# Patient Record
Sex: Female | Born: 1945
Health system: Southern US, Community
[De-identification: ages and names within clinical notes are randomized; demographics above are authoritative.]

## PROBLEM LIST (undated history)

## (undated) DIAGNOSIS — H3530 Unspecified macular degeneration: Secondary | ICD-10-CM

## (undated) DIAGNOSIS — M109 Gout, unspecified: Secondary | ICD-10-CM

## (undated) DIAGNOSIS — I10 Essential (primary) hypertension: Secondary | ICD-10-CM

## (undated) HISTORY — PX: OTHER SURGICAL HISTORY: SHX169

## (undated) HISTORY — PX: TUBAL LIGATION: SHX77

---

## 2014-06-24 DIAGNOSIS — E876 Hypokalemia: Secondary | ICD-10-CM | POA: Diagnosis not present

## 2014-06-24 DIAGNOSIS — I10 Essential (primary) hypertension: Secondary | ICD-10-CM | POA: Diagnosis not present

## 2014-06-24 DIAGNOSIS — J069 Acute upper respiratory infection, unspecified: Secondary | ICD-10-CM | POA: Diagnosis not present

## 2014-06-24 DIAGNOSIS — R05 Cough: Secondary | ICD-10-CM | POA: Diagnosis not present

## 2014-06-24 DIAGNOSIS — E780 Pure hypercholesterolemia: Secondary | ICD-10-CM | POA: Diagnosis not present

## 2014-06-24 DIAGNOSIS — J209 Acute bronchitis, unspecified: Secondary | ICD-10-CM | POA: Diagnosis not present

## 2014-06-26 ENCOUNTER — Encounter (HOSPITAL_COMMUNITY): Payer: Self-pay | Admitting: *Deleted

## 2014-06-26 ENCOUNTER — Inpatient Hospital Stay (HOSPITAL_COMMUNITY)
Admission: EM | Admit: 2014-06-26 | Discharge: 2014-06-29 | DRG: 195 | Disposition: A | Discharge status: Home / Self Care | Payer: Medicare Other | Attending: Internal Medicine | Admitting: Internal Medicine

## 2014-06-26 ENCOUNTER — Emergency Department (HOSPITAL_COMMUNITY): Payer: Medicare Other

## 2014-06-26 DIAGNOSIS — Z79899 Other long term (current) drug therapy: Secondary | ICD-10-CM | POA: Diagnosis not present

## 2014-06-26 DIAGNOSIS — R42 Dizziness and giddiness: Secondary | ICD-10-CM | POA: Diagnosis not present

## 2014-06-26 DIAGNOSIS — D649 Anemia, unspecified: Secondary | ICD-10-CM | POA: Diagnosis present

## 2014-06-26 DIAGNOSIS — R918 Other nonspecific abnormal finding of lung field: Secondary | ICD-10-CM | POA: Diagnosis not present

## 2014-06-26 DIAGNOSIS — I10 Essential (primary) hypertension: Secondary | ICD-10-CM | POA: Diagnosis not present

## 2014-06-26 DIAGNOSIS — J40 Bronchitis, not specified as acute or chronic: Secondary | ICD-10-CM | POA: Diagnosis not present

## 2014-06-26 DIAGNOSIS — R9431 Abnormal electrocardiogram [ECG] [EKG]: Secondary | ICD-10-CM | POA: Diagnosis not present

## 2014-06-26 DIAGNOSIS — J189 Pneumonia, unspecified organism: Principal | ICD-10-CM | POA: Diagnosis present

## 2014-06-26 DIAGNOSIS — R0602 Shortness of breath: Secondary | ICD-10-CM | POA: Diagnosis not present

## 2014-06-26 DIAGNOSIS — E876 Hypokalemia: Secondary | ICD-10-CM | POA: Diagnosis not present

## 2014-06-26 DIAGNOSIS — I4891 Unspecified atrial fibrillation: Secondary | ICD-10-CM | POA: Diagnosis present

## 2014-06-26 HISTORY — DX: Essential (primary) hypertension: I10

## 2014-06-26 LAB — CBC WITH DIFFERENTIAL/PLATELET
BASOS PCT: 0 % (ref 0–1)
Basophils Absolute: 0 10*3/uL (ref 0.0–0.1)
EOS PCT: 0 % (ref 0–5)
Eosinophils Absolute: 0 10*3/uL (ref 0.0–0.7)
HCT: 38.4 % (ref 36.0–46.0)
Hemoglobin: 13.2 g/dL (ref 12.0–15.0)
LYMPHS PCT: 21 % (ref 12–46)
Lymphs Abs: 1.5 10*3/uL (ref 0.7–4.0)
MCH: 30.7 pg (ref 26.0–34.0)
MCHC: 34.4 g/dL (ref 30.0–36.0)
MCV: 89.3 fL (ref 78.0–100.0)
Monocytes Absolute: 0.6 10*3/uL (ref 0.1–1.0)
Monocytes Relative: 9 % (ref 3–12)
Neutro Abs: 4.8 10*3/uL (ref 1.7–7.7)
Neutrophils Relative %: 70 % (ref 43–77)
Platelets: 266 10*3/uL (ref 150–400)
RBC: 4.3 MIL/uL (ref 3.87–5.11)
RDW: 13.5 % (ref 11.5–15.5)
WBC: 6.9 10*3/uL (ref 4.0–10.5)

## 2014-06-26 LAB — BASIC METABOLIC PANEL
ANION GAP: 13 (ref 5–15)
BUN: 20 mg/dL (ref 6–23)
CALCIUM: 9.5 mg/dL (ref 8.4–10.5)
CO2: 25 mmol/L (ref 19–32)
CREATININE: 1.01 mg/dL (ref 0.50–1.10)
Chloride: 98 mmol/L (ref 96–112)
GFR calc Af Amer: 64 mL/min — ABNORMAL LOW (ref >90)
GFR, EST NON AFRICAN AMERICAN: 55 mL/min — AB (ref >90)
GLUCOSE: 132 mg/dL — AB (ref 70–99)
Potassium: 2.7 mmol/L — CL (ref 3.5–5.1)
Sodium: 136 mmol/L (ref 135–145)

## 2014-06-26 LAB — TROPONIN I: Troponin I: <0.03 ng/mL (ref <0.031)

## 2014-06-26 LAB — MAGNESIUM: MAGNESIUM: 1.9 mg/dL (ref 1.5–2.5)

## 2014-06-26 IMAGING — DX DG CHEST 2V
2 series · 2 of 2 positions shown · non-contrast
Comparison: None.

CLINICAL DATA: 68-year-old female with a 2 week history of cough,
congestion and bronchitis.

EXAM:
CHEST  2 VIEW

[chest pa]
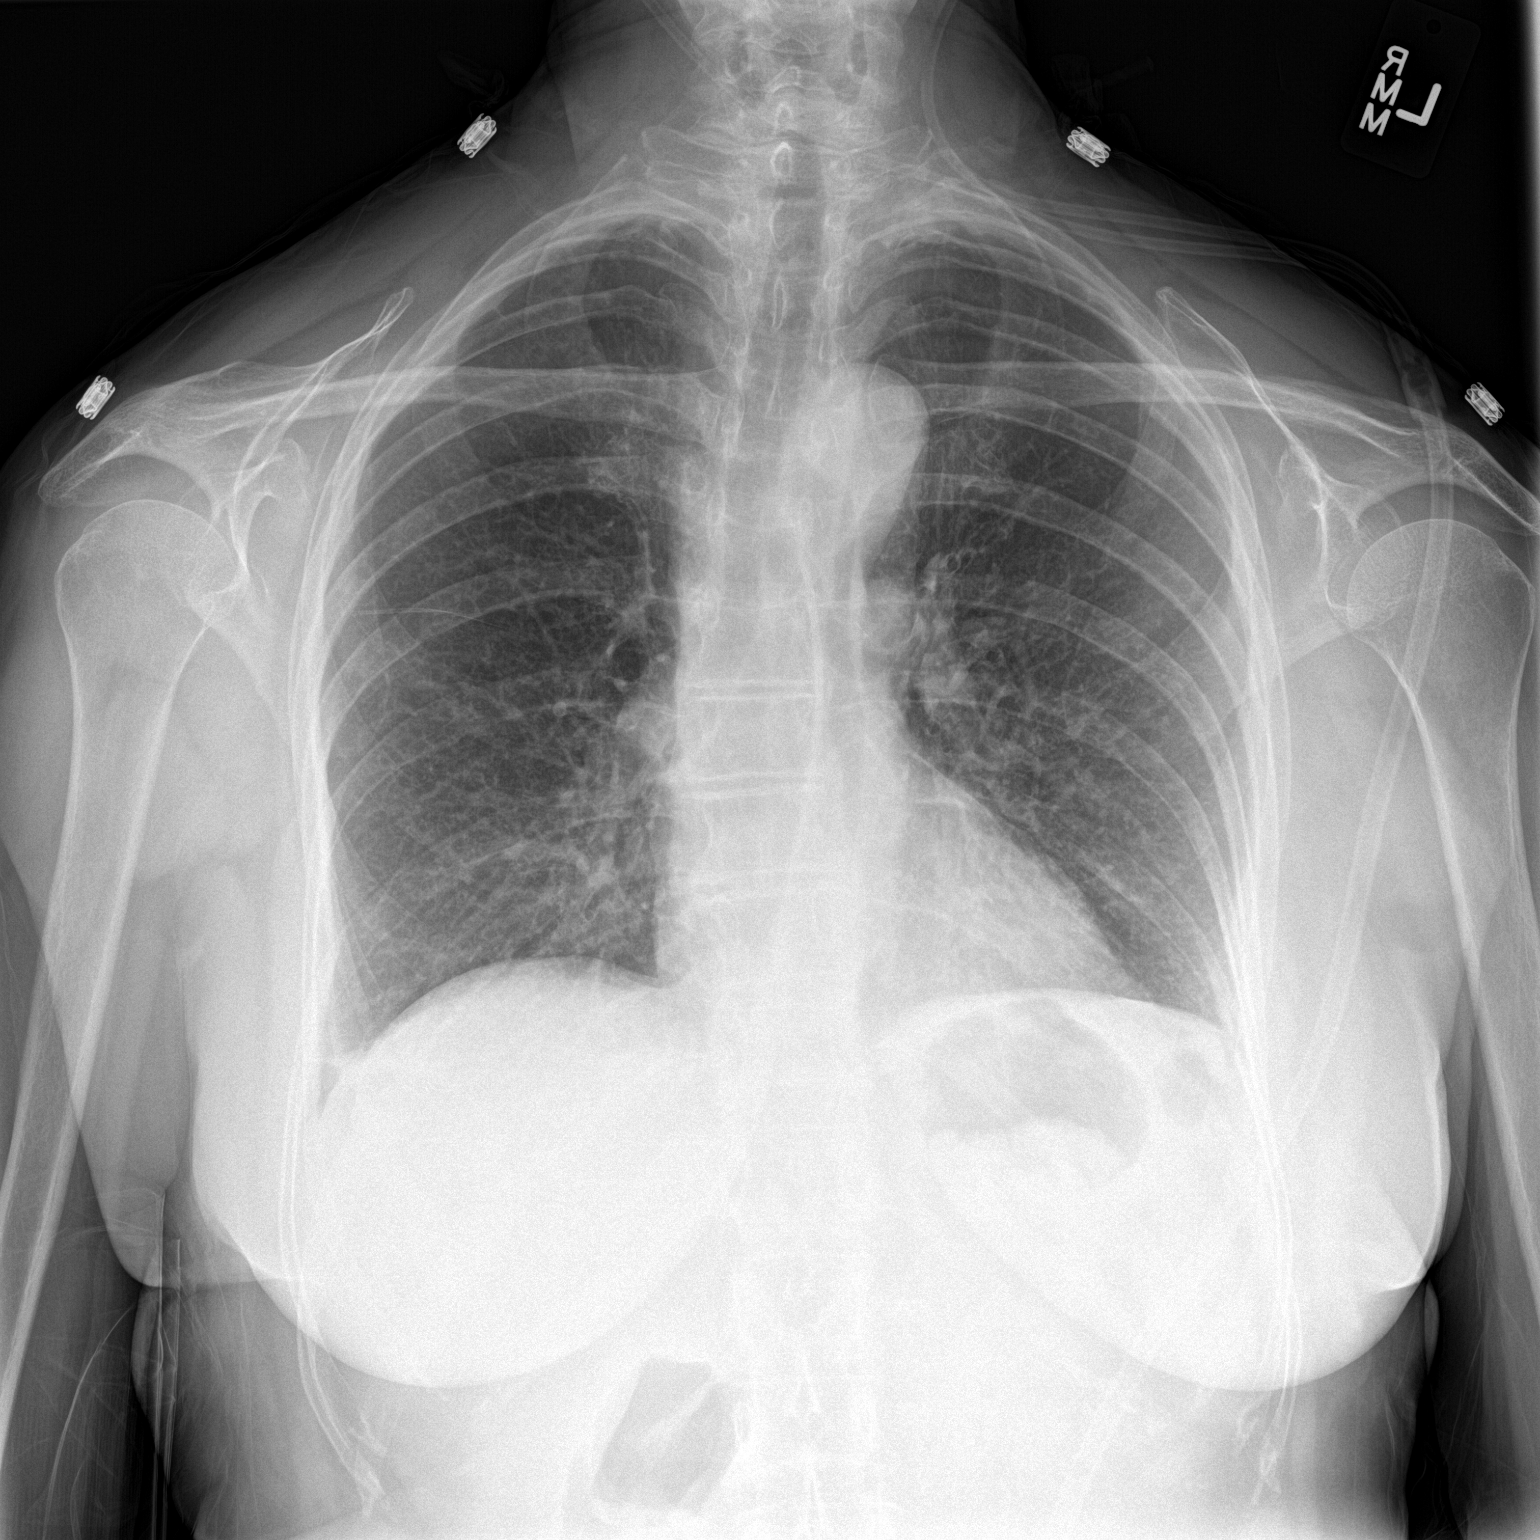

[chest lat]
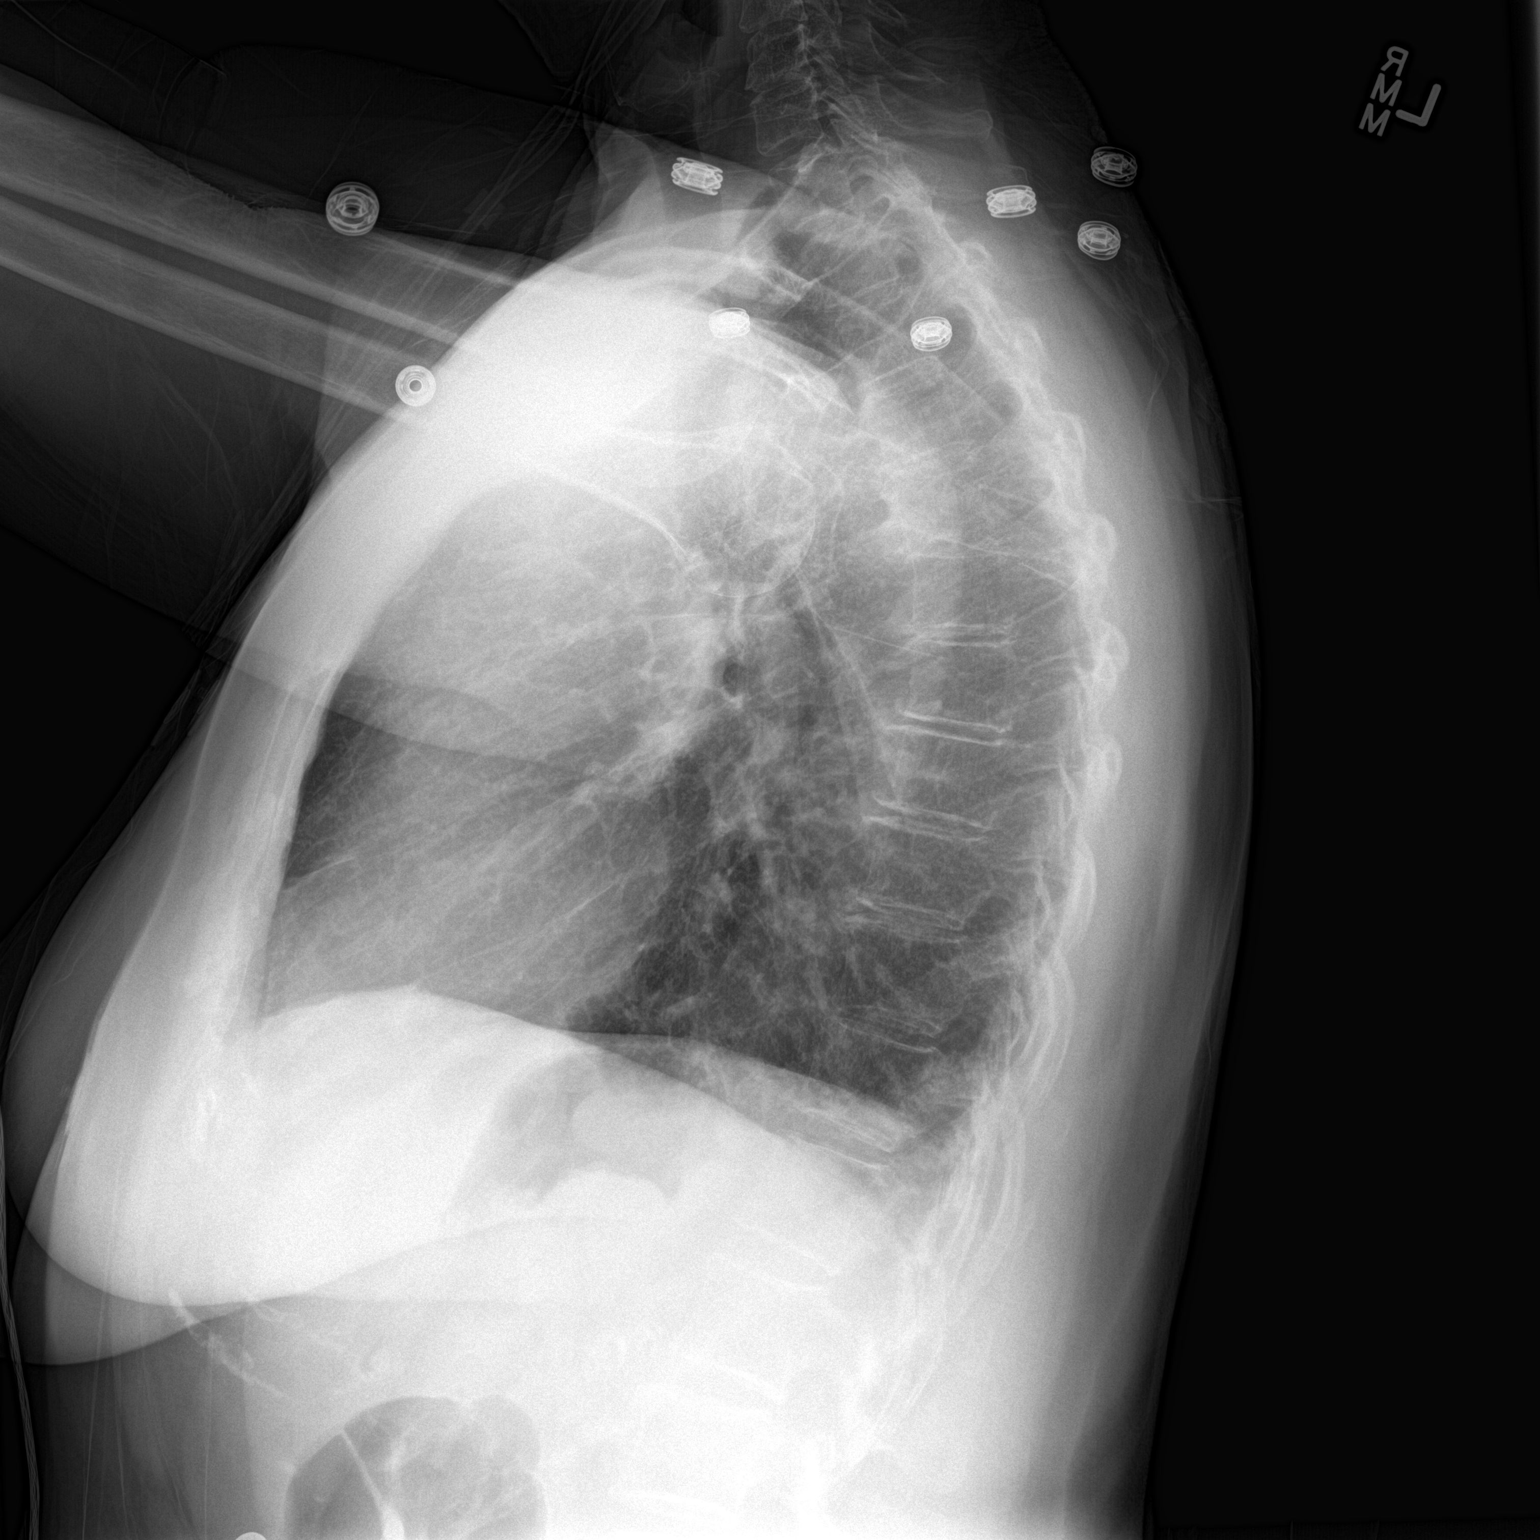

[2 of 2 positions shown; findings below may reference images not displayed]

FINDINGS: Focal patchy airspace opacity in the posterolateral right lower
lobe. Nonspecific central bronchitic change and coarsening of the
interstitial markings may be chronic. Cardiac and mediastinal
contours are within normal limits. No pleural effusion, pulmonary
edema or pneumothorax. No acute osseous abnormality.
IMPRESSION: 1. Patchy airspace opacity in the posterolateral right lower lobe
may concerning for early bronchopneumonia.
2. Central bronchitic change and interstitial coarsening are favored
to reflect chronic parenchymal changes. However, without prior
imaging for comparison an acute atypical infectious process is
difficult to exclude entirely.

## 2014-06-26 MED ORDER — ASPIRIN EC 325 MG PO TBEC
325.0000 mg | DELAYED_RELEASE_TABLET | Freq: Once | ORAL | Status: AC
  Administered 2014-06-26: 325 mg via ORAL
  Filled 2014-06-26: qty 1

## 2014-06-26 MED ORDER — DEXTROSE 5 % IV SOLN
1.0000 g | INTRAVENOUS | Status: DC
  Administered 2014-06-26 – 2014-06-28 (×3): 1 g via INTRAVENOUS
  Filled 2014-06-26 (×4): qty 10

## 2014-06-26 MED ORDER — POTASSIUM CHLORIDE CRYS ER 20 MEQ PO TBCR
40.0000 meq | EXTENDED_RELEASE_TABLET | Freq: Once | ORAL | Status: DC
  Filled 2014-06-26: qty 2

## 2014-06-26 MED ORDER — DEXTROSE 5 % IV SOLN: 500.0000 mg | INTRAVENOUS | Status: DC

## 2014-06-26 MED ORDER — GUAIFENESIN ER 600 MG PO TB12
600.0000 mg | ORAL_TABLET | Freq: Two times a day (BID) | ORAL | Status: DC
  Administered 2014-06-26 – 2014-06-29 (×6): 600 mg via ORAL
  Filled 2014-06-26 (×7): qty 1

## 2014-06-26 MED ORDER — DILTIAZEM LOAD VIA INFUSION
10.0000 mg | Freq: Once | INTRAVENOUS | Status: AC
  Administered 2014-06-26: 10 mg via INTRAVENOUS

## 2014-06-26 MED ORDER — DEXTROSE 5 % IV SOLN
1.0000 g | Freq: Once | INTRAVENOUS | Status: AC
  Administered 2014-06-26: 1 g via INTRAVENOUS
  Filled 2014-06-26: qty 10

## 2014-06-26 MED ORDER — HEPARIN BOLUS VIA INFUSION
4000.0000 [IU] | Freq: Once | INTRAVENOUS | Status: AC
  Administered 2014-06-26: 4000 [IU] via INTRAVENOUS
  Filled 2014-06-26: qty 4000

## 2014-06-26 MED ORDER — HYDROCOD POLST-CHLORPHEN POLST 10-8 MG/5ML PO LQCR
5.0000 mL | Freq: Once | ORAL | Status: AC
Start: 2014-06-26 — End: 2014-06-26
  Administered 2014-06-26: 5 mL via ORAL
  Filled 2014-06-26: qty 5

## 2014-06-26 MED ORDER — DEXTROSE 5 % IV SOLN
500.0000 mg | INTRAVENOUS | Status: DC
  Administered 2014-06-27 – 2014-06-28 (×2): 500 mg via INTRAVENOUS
  Filled 2014-06-26 (×3): qty 500

## 2014-06-26 MED ORDER — SODIUM CHLORIDE 0.9 % IV SOLN
INTRAVENOUS | Status: DC
  Administered 2014-06-26 – 2014-06-27 (×2): via INTRAVENOUS

## 2014-06-26 MED ORDER — SODIUM CHLORIDE 0.9 % IV BOLUS (SEPSIS)
1000.0000 mL | Freq: Once | INTRAVENOUS | Status: AC
  Administered 2014-06-26: 1000 mL via INTRAVENOUS

## 2014-06-26 MED ORDER — DILTIAZEM HCL 100 MG IV SOLR
5.0000 mg/h | INTRAVENOUS | Status: DC
  Administered 2014-06-26: 5 mg/h via INTRAVENOUS

## 2014-06-26 MED ORDER — AZITHROMYCIN 500 MG IV SOLR
500.0000 mg | Freq: Once | INTRAVENOUS | Status: AC
  Administered 2014-06-26: 500 mg via INTRAVENOUS
  Filled 2014-06-26: qty 500

## 2014-06-26 MED ORDER — POTASSIUM CHLORIDE 20 MEQ/15ML (10%) PO SOLN
40.0000 meq | Freq: Once | ORAL | Status: AC
  Administered 2014-06-26: 40 meq via ORAL
  Filled 2014-06-26: qty 30

## 2014-06-26 MED ORDER — POTASSIUM CHLORIDE 10 MEQ/100ML IV SOLN
10.0000 meq | Freq: Once | INTRAVENOUS | Status: AC
  Administered 2014-06-26: 10 meq via INTRAVENOUS
  Filled 2014-06-26: qty 100

## 2014-06-26 MED ORDER — HEPARIN (PORCINE) IN NACL 100-0.45 UNIT/ML-% IJ SOLN
1200.0000 [IU]/h | INTRAMUSCULAR | Status: DC
  Administered 2014-06-26: 1200 [IU]/h via INTRAVENOUS
  Filled 2014-06-26 (×2): qty 250

## 2014-06-26 NOTE — ED Notes (Signed)
[facility]. Pt reports she was dx with bronchitis on Tuesday by PCP after coughing x2 weeks. Pt denies chest pain, n/v, dizziness and lightheadedness.

## 2014-06-26 NOTE — H&P (Signed)
[facility] History and Physical  Patient: Lacey Tran  MRN: [id number]  DOB: 1945-10-18  DOS: the patient was seen and examined on 06/26/2014 PCP: Estanislado Pandy, MD  Chief Complaint: Shortness of breath  HPI: Lacey Tran is a 69 y.o. female with Past medical history of hypertension. The patient presented with complaints of shortness of breath. She mentions that since last 2-3 weeks she has been having productive cough. She was initially treated as bronchitis when she has seen by her PCP. She has received a steroid injection and Tessalon Perles for symptomatic treatment. Despite this her cough has not improved and she progressively has worsening cough. To the extent that she occasionally has some abdominal pain because of the cough. She also started having fever. Over last 2 days that she has episodes of shortness of breath to the extent she would be choking and gasping for air. Due to one of her episode today she was brought here to the ER. Patient denies any complaints of chest pain or chest tightness. She complains of dizziness and lightheadedness with poor oral intake. No prior history of bleeding or surgery or head injury. She has a history of high blood pressure and has been taking hydrochlorothiazide and has been having difficulty maintaining her potassium level.  The patient is coming from home And at her baseline independent for most of her ADL.  Review of Systems: as mentioned in the history of present illness.  A Comprehensive review of the other systems is negative.  Past Medical History  Diagnosis Date  . Hypertension    History reviewed. No pertinent past surgical history. Social History:  reports that she has never smoked. She does not have any smokeless tobacco history on file. She reports that she does not drink alcohol. Her drug history is not on file.  No Known Allergies  History reviewed. No pertinent family history.  Prior to Admission medications    Medication Sig Start Date End Date Taking? Authorizing Provider  benzonatate (TESSALON) 100 MG capsule Take 100 mg by mouth 3 (three) times daily as needed for cough.   Yes Historical Provider, MD  hydrochlorothiazide (HYDRODIURIL) 25 MG tablet Take 25 mg by mouth daily.   Yes Historical Provider, MD  potassium chloride (K-DUR,KLOR-CON) 10 MEQ tablet Take 10 mEq by mouth daily.   Yes Historical Provider, MD    Physical Exam: Filed Vitals:   06/26/14 2000 06/26/14 2015 06/26/14 2045 06/26/14 2100  BP: 142/87 150/78 138/88 128/81  Pulse: 120 127 130 84  Resp: SpO2: 100% 100% 98% 96%    General: Alert, Awake and Oriented to Time, Place and Person. Appear in mild distress Eyes: PERRL ENT: Oral Mucosa clear moist. Neck: mild JVD Cardiovascular: S1 and S2 Present, no Murmur, Peripheral Pulses Present Respiratory: Bilateral Air entry equal and Decreased, bilateral right more than left Crackles, no wheezes Abdomen: Bowel Sound present, Soft and no tender Skin: no Rash Extremities: no Pedal edema, no calf tenderness Neurologic: Grossly no focal neuro deficit.  Labs on Admission:  CBC:  Recent Labs Lab 06/26/14 1802  WBC 6.9  NEUTROABS 4.8  HGB 13.2  HCT 38.4  MCV 89.3  PLT 266    CMP     Component Value Date/Time   NA 136 06/26/2014 1802   K 2.7* 06/26/2014 1802   CL 98 06/26/2014 1802   CO2 25 06/26/2014 1802   GLUCOSE 132* 06/26/2014 1802   BUN 20 06/26/2014 1802   CREATININE  1.01 06/26/2014 1802   CALCIUM 9.5 06/26/2014 1802   GFRNONAA 55* 06/26/2014 1802   GFRAA 64* 06/26/2014 1802    No results for input(s): LIPASE, AMYLASE in the last 168 hours.   Recent Labs Lab 06/26/14 1802  TROPONINI <0.03   BNP (last 3 results) No results for input(s): PROBNP in the last 8760 hours.  Radiological Exams on Admission: Dg Chest 2 View  06/26/2014   CLINICAL DATA:  69 year old female with a 2 week history of cough, congestion and bronchitis.  EXAM:  CHEST  2 VIEW  COMPARISON:  None.  FINDINGS: Focal patchy airspace opacity in the posterolateral right lower lobe. Nonspecific central bronchitic change and coarsening of the interstitial markings may be chronic. Cardiac and mediastinal contours are within normal limits. No pleural effusion, pulmonary edema or pneumothorax. No acute osseous abnormality.  IMPRESSION: 1. Patchy airspace opacity in the posterolateral right lower lobe may concerning for early bronchopneumonia. 2. Central bronchitic change and interstitial coarsening are favored to reflect chronic parenchymal changes. However, without prior imaging for comparison an acute atypical infectious process is difficult to exclude entirely.   Electronically Signed   By: Malachy MoanHeath  McCullough M.D.   On: 06/26/2014 18:51    EKG: Independently reviewed. atrial fibrillation, RVR.  Assessment/Plan Principal Problem:   CAP (community acquired pneumonia) Active Problems:   Atrial fibrillation with RVR, CHA2DS2-VASc Score 3, female, age 15>65, HTN   Abnormal EKG   Hypertension, essential   Hypokalemia   1. CAP (community acquired pneumonia) The patient is presenting with a 2 weeks history of cough with congestion and shortness of breath. Her chest x-ray shows right lower lobe pneumonia. The patient has blood cultures drawn would continue treating her with ceftriaxone and azithromycin. Follow cultures and urine antigens.  2. A. fib with RVR. Abnormal EKG. The patient presented with complaints of cough and shortness of breath. While she was being treated for community-acquired pneumonia she was also found to have A. fib with RVR in the hospital. She had an EKG initially drawn which was showing ST elevation in aVL. This was discussed with cardiology by ER who recommended that the patient does not have any ST elevation and recommended serial troponin. The patient has [place] Vasc score of 3 with her age more than 2665, history of hypertension, and female  sex. [facility]. Patient was started on heparin. Follow serial troponin. Echogram in the morning. Family and patient is undecided about long-term anticoagulation.  3. Hypokalemia. Likely secondary to poor oral intake as well as the hydrochlorothiazide. At present replacing. Check magnesium.  4. Hypertension. Holding hydrochlorthiazide. Blood pressure at present stable. Continue Cardizem.  Advance goals of care discussion: Full code   DVT Prophylaxis therapeutic  anticoagulation Nutrition: Nothing by mouth after midnight  Family Communication: Family  was present at bedside, opportunity was given to ask question and all questions were answered satisfactorily at the time of interview. Disposition: Admitted to inpatient in telemetry unit.  Author: Lynden OxfordPranav Shuntell Foody, MD [facility] Pager: [phone] 06/26/2014, 9:11 PM    If 7PM-7AM, please contact night-coverage [email or web address] Password [email or web address]

## 2014-06-26 NOTE — ED Notes (Signed)
Attempted report to 3 W. 

## 2014-06-26 NOTE — Consult Note (Signed)
ANTICOAGULATION CONSULT NOTE - Initial Consult  Pharmacy Consult for Heparin Indication: atrial fibrillation  No Known Allergies  Patient Measurements: Height: 5\' 6"  (167.6 cm) Weight: 152 lb (68.947 kg) IBW/kg (Calculated) : 59.3 Heparin Dosing Weight: 68kg  Vital Signs: Temp: 98.1 F (36.7 C) (01/28 2123) BP: 114/80 mmHg (01/28 2123) Pulse Rate: 80 (01/28 2123)  Labs:  Recent Labs  06/26/14 1802  HGB 13.2  HCT 38.4  PLT 266  CREATININE 1.01  TROPONINI <0.03    Estimated Creatinine Clearance: 49.2 mL/min (by C-G formula based on Cr of 1.01).   Medical History: Past Medical History  Diagnosis Date  . Hypertension     Medications:  No anticoagulants pta  Assessment: 68yof presents to the ED with SOB - was diagnosed with bronchitis this past Tuesday. While in the ED she developed afib RVR (chadsvasc = 3). She will begin IV heparin. Baseline renal function and CBC wnl.  Goal of Therapy:  Heparin level 0.3-0.7 units/ml Monitor platelets by anticoagulation protocol: Yes   Plan:  1) Heparin bolus 4000 units x 1 2) Heparin drip at 1200 units/hr 3) Check 8 hour heparin level 4) Daily heparin level and CBC  Fredrik RiggerMarkle, Lacey Tran 06/26/2014,9:44 PM

## 2014-06-26 NOTE — ED Provider Notes (Signed)
CSN: [id number]     Arrival date & time 06/26/14  1748 History   First MD Initiated Contact with Patient 06/26/14 1755     Chief Complaint  Patient presents with  . Shortness of Breath     (Consider location/radiation/quality/duration/timing/severity/associated sxs/prior Treatment) Patient is a 69 y.o. female presenting with shortness of breath. The history is provided by the patient. No language interpreter was used.  Shortness of Breath Severity:  Moderate Onset quality:  Gradual Duration:  2 weeks Timing:  Constant Progression:  Waxing and waning Chronicity:  New Relieved by:  Nothing Worsened by:  Nothing tried Ineffective treatments: Steroids. Associated symptoms: cough and sputum production   Associated symptoms: no abdominal pain, no chest pain, no ear pain, no fever, no rash, no vomiting and no wheezing   Cough:    Cough characteristics:  Productive   Sputum characteristics:  White   Severity:  Mild   Onset quality:  Gradual   Duration:  1 week   Timing:  Constant   Progression:  Unchanged   Chronicity:  New   Past Medical History  Diagnosis Date  . Hypertension    History reviewed. No pertinent past surgical history. History reviewed. No pertinent family history. History  Substance Use Topics  . Smoking status: Never Smoker   . Smokeless tobacco: Not on file  . Alcohol Use: No   OB History    No data available     Review of Systems  Constitutional: Negative for fever.  HENT: Negative for ear pain.   Respiratory: Positive for cough, sputum production and shortness of breath. Negative for wheezing.   Cardiovascular: Negative for chest pain.  Gastrointestinal: Negative for nausea, vomiting and abdominal pain.  Genitourinary: Negative for dysuria, urgency and frequency.  Skin: Negative for rash.  All other systems reviewed and are negative.     Allergies  Review of patient's allergies indicates no known allergies.  Home Medications   Prior to  Admission medications   Medication Sig Start Date End Date Taking? Authorizing Provider  benzonatate (TESSALON) 100 MG capsule Take 100 mg by mouth 3 (three) times daily as needed for cough.   Yes Historical Provider, MD  hydrochlorothiazide (HYDRODIURIL) 25 MG tablet Take 25 mg by mouth daily.   Yes Historical Provider, MD  potassium chloride (K-DUR,KLOR-CON) 10 MEQ tablet Take 10 mEq by mouth daily.   Yes Historical Provider, MD   BP 112/66 mmHg  Pulse 56  Temp(Src) 98 F (36.7 C) (Oral)  Resp 16  Ht 5\' 6"  (1.676 m)  Wt 152 lb (68.947 kg)  BMI 24.55 kg/m2  SpO2 100% Physical Exam  Constitutional: She is oriented to person, place, and time. She appears well-developed and well-nourished. No distress.  HENT:  Head: Normocephalic and atraumatic.  Eyes: Pupils are equal, round, and reactive to light.  Neck: Normal range of motion.  Cardiovascular: Normal rate, regular rhythm, normal heart sounds and intact distal pulses.   Pulmonary/Chest: Effort normal. No respiratory distress. She has no wheezes. She has rhonchi in the right lower field. She exhibits no tenderness.  Abdominal: Soft. Bowel sounds are normal. She exhibits no distension. There is no tenderness. There is no rebound and no guarding.  Neurological: She is alert and oriented to person, place, and time. She has normal strength. No cranial nerve deficit or sensory deficit. She exhibits normal muscle tone. Coordination and gait normal.  Skin: Skin is warm and dry.  Nursing note and vitals reviewed.   ED Course  Procedures (including critical care time) Labs Review Labs Reviewed  BASIC METABOLIC PANEL - Abnormal; Notable for the following:    Potassium 2.7 (*)    Glucose, Bld 132 (*)    GFR calc non Af Amer 55 (*)    GFR calc Af Amer 64 (*)    All other components within normal limits  CBC - Abnormal; Notable for the following:    RBC 3.82 (*)    Hemoglobin 11.5 (*)    HCT 34.8 (*)    All other components within normal  limits  HEPARIN LEVEL (UNFRACTIONATED) - Abnormal; Notable for the following:    Heparin Unfractionated 1.20 (*)    All other components within normal limits  COMPREHENSIVE METABOLIC PANEL - Abnormal; Notable for the following:    Albumin 3.1 (*)    GFR calc non Af Amer 83 (*)    All other components within normal limits  CULTURE, EXPECTORATED SPUTUM-ASSESSMENT  CULTURE, BLOOD (ROUTINE X 2)  CULTURE, BLOOD (ROUTINE X 2)  GRAM STAIN  CULTURE, RESPIRATORY (NON-EXPECTORATED)  CBC WITH DIFFERENTIAL/PLATELET  TROPONIN I  MAGNESIUM  TROPONIN I  TROPONIN I  TROPONIN I  INFLUENZA PANEL BY PCR (TYPE A & B, H1N1)  MAGNESIUM  TSH  HIV ANTIBODY (ROUTINE TESTING)  LEGIONELLA ANTIGEN, URINE  STREP PNEUMONIAE URINARY ANTIGEN    Imaging Review Dg Chest 2 View  06/26/2014   CLINICAL DATA:  69 year old female with a 2 week history of cough, congestion and bronchitis.  EXAM: CHEST  2 VIEW  COMPARISON:  None.  FINDINGS: Focal patchy airspace opacity in the posterolateral right lower lobe. Nonspecific central bronchitic change and coarsening of the interstitial markings may be chronic. Cardiac and mediastinal contours are within normal limits. No pleural effusion, pulmonary edema or pneumothorax. No acute osseous abnormality.  IMPRESSION: 1. Patchy airspace opacity in the posterolateral right lower lobe may concerning for early bronchopneumonia. 2. Central bronchitic change and interstitial coarsening are favored to reflect chronic parenchymal changes. However, without prior imaging for comparison an acute atypical infectious process is difficult to exclude entirely.   Electronically Signed   By: Malachy Moan M.D.   On: 06/26/2014 18:51     EKG Interpretation   Date/Time:  Thursday June 26 2014 17:54:30 EST Ventricular Rate:  92 PR Interval:  170 QRS Duration: 91 QT Interval:  365 QTC Calculation: 451 R Axis:   -63 Text Interpretation:  Sinus rhythm Left anterior fascicular block  Abnormal  R-wave progression, late transition Artifact in limb leads ST depressions  anterior leads No old tracing to compare Reconfirmed by GOLDSTON  MD,  SCOTT ([id number]) on 06/26/2014 6:07:09 PM      MDM   Final diagnoses:  CAP (community acquired pneumonia)  Atrial fibrillation with RVR, CHA2DS2-VASc Score 3, female, age >35, HTN  Abnormal EKG  Hypokalemia   Patient is a 69 year old Caucasian female with pertinent past medical history of hypertension who comes to the emergency department today with cough and shortness of breath for the past week. Physical exam as above. En route to the emergency department EMS obtained an EKG which was concerning with an elevation in aVR and diffuse ST depressions. This EKG was discussed with the Cardiology fellow who did not recommend calling this a STEMI and recommended further evaluation as warranted by the patient's condition.  Patient has had no chest pain or exertional shortness of breath as result I doubt an MI. However patient's initial presentation is concerning for pneumonia, ACS, pneumothorax, or bronchitis. Initial workup  included CBC, BMP, troponin, EKG, and a chest x-ray. Chest x-ray demonstrated consolidations in the right lower lobe concerning for pneumonia consistent with the patient's exam.  As a result the patient was treated with azithromycin and Rocephin for community acquired pneumonia. Patient's potassium was low at 2.7 patient's potassium was repleted with 40 mEq of potassium. CBC was unremarkable. Initial troponin was negative.  While in the emergency department patient was treated with aspirin for potential ACS.  While being evaluated she developed atrial fibrillation with RVR with rate of 120s. She was treated with diltiazem and started on a diltiazem drip.  Patient never had atrial fibrillation before. As a result I feel the patient requires admission to the hospital for treatment for her pneumonia and an ACS rule out. The patient was  admitted to the hospitalist service in good condition. Labs and imaging were reviewed by myself and considered in medical decision making. Imaging was interpreted by radiology. Care was discussed with my attending Dr. Criss Alvine.      Bethann Berkshire, MD 06/27/14 1145  Audree Camel, MD 07/05/14 [phone]

## 2014-06-26 NOTE — ED Notes (Signed)
Cardizem increased to 10 mg/hr d/t hr continuing in 120's.

## 2014-06-26 NOTE — ED Notes (Signed)
Dr. Patel at bedside 

## 2014-06-26 NOTE — ED Notes (Addendum)
Called lab RE troponin.  They stated labs are going to be 30 min behind b/c their machine is not working well.  MD notified.

## 2014-06-26 NOTE — ED Notes (Signed)
Attempted to give report x2 

## 2014-06-26 NOTE — ED Notes (Signed)
Dr. Noreene FilbertSchmidt notified about K level.

## 2014-06-27 ENCOUNTER — Encounter (HOSPITAL_COMMUNITY): Payer: Self-pay | Admitting: Cardiology

## 2014-06-27 ENCOUNTER — Other Ambulatory Visit: Payer: Self-pay | Admitting: Cardiology

## 2014-06-27 DIAGNOSIS — R000 Tachycardia, unspecified: Secondary | ICD-10-CM

## 2014-06-27 DIAGNOSIS — R9431 Abnormal electrocardiogram [ECG] [EKG]: Secondary | ICD-10-CM

## 2014-06-27 DIAGNOSIS — I480 Paroxysmal atrial fibrillation: Secondary | ICD-10-CM

## 2014-06-27 DIAGNOSIS — I4891 Unspecified atrial fibrillation: Secondary | ICD-10-CM

## 2014-06-27 DIAGNOSIS — J189 Pneumonia, unspecified organism: Principal | ICD-10-CM

## 2014-06-27 LAB — CBC
HCT: 34.8 % — ABNORMAL LOW (ref 36.0–46.0)
HEMOGLOBIN: 11.5 g/dL — AB (ref 12.0–15.0)
MCH: 30.1 pg (ref 26.0–34.0)
MCHC: 33 g/dL (ref 30.0–36.0)
MCV: 91.1 fL (ref 78.0–100.0)
PLATELETS: 224 10*3/uL (ref 150–400)
RBC: 3.82 MIL/uL — ABNORMAL LOW (ref 3.87–5.11)
RDW: 13.9 % (ref 11.5–15.5)
WBC: 6.2 10*3/uL (ref 4.0–10.5)

## 2014-06-27 LAB — COMPREHENSIVE METABOLIC PANEL
ALBUMIN: 3.1 g/dL — AB (ref 3.5–5.2)
ALT: 17 U/L (ref 0–35)
AST: 27 U/L (ref 0–37)
Alkaline Phosphatase: 55 U/L (ref 39–117)
Anion gap: 6 (ref 5–15)
BUN: 13 mg/dL (ref 6–23)
CHLORIDE: 106 mmol/L (ref 96–112)
CO2: 28 mmol/L (ref 19–32)
Calcium: 8.6 mg/dL (ref 8.4–10.5)
Creatinine, Ser: 0.78 mg/dL (ref 0.50–1.10)
GFR calc Af Amer: >90 mL/min (ref >90)
GFR, EST NON AFRICAN AMERICAN: 83 mL/min — AB (ref >90)
Glucose, Bld: 97 mg/dL (ref 70–99)
POTASSIUM: 3.5 mmol/L (ref 3.5–5.1)
SODIUM: 140 mmol/L (ref 135–145)
Total Bilirubin: 0.7 mg/dL (ref 0.3–1.2)
Total Protein: 7.1 g/dL (ref 6.0–8.3)

## 2014-06-27 LAB — TSH: TSH: 3.867 u[IU]/mL (ref 0.350–4.500)

## 2014-06-27 LAB — EXPECTORATED SPUTUM ASSESSMENT W REFEX TO RESP CULTURE

## 2014-06-27 LAB — INFLUENZA PANEL BY PCR (TYPE A & B)
H1N1FLUPCR: NOT DETECTED
Influenza A By PCR: NEGATIVE
Influenza B By PCR: NEGATIVE

## 2014-06-27 LAB — MAGNESIUM: MAGNESIUM: 1.9 mg/dL (ref 1.5–2.5)

## 2014-06-27 LAB — TROPONIN I: Troponin I: <0.03 ng/mL (ref <0.031)

## 2014-06-27 LAB — EXPECTORATED SPUTUM ASSESSMENT W GRAM STAIN, RFLX TO RESP C

## 2014-06-27 LAB — HEPARIN LEVEL (UNFRACTIONATED): Heparin Unfractionated: 1.2 IU/mL — ABNORMAL HIGH (ref 0.30–0.70)

## 2014-06-27 MED ORDER — DILTIAZEM HCL 30 MG PO TABS
30.0000 mg | ORAL_TABLET | Freq: Four times a day (QID) | ORAL | Status: AC
  Administered 2014-06-27 (×2): 30 mg via ORAL
  Filled 2014-06-27 (×4): qty 1

## 2014-06-27 MED ORDER — RIVAROXABAN 20 MG PO TABS
20.0000 mg | ORAL_TABLET | Freq: Every day | ORAL | Status: DC
  Filled 2014-06-27: qty 1

## 2014-06-27 MED ORDER — DILTIAZEM HCL ER COATED BEADS 120 MG PO CP24
120.0000 mg | ORAL_CAPSULE | Freq: Every day | ORAL | Status: DC
  Administered 2014-06-28 – 2014-06-29 (×2): 120 mg via ORAL
  Filled 2014-06-27 (×2): qty 1

## 2014-06-27 MED ORDER — HEPARIN (PORCINE) IN NACL 100-0.45 UNIT/ML-% IJ SOLN
950.0000 [IU]/h | INTRAMUSCULAR | Status: DC
  Administered 2014-06-27: 950 [IU]/h via INTRAVENOUS
  Filled 2014-06-27: qty 250

## 2014-06-27 MED ORDER — POTASSIUM CHLORIDE CRYS ER 20 MEQ PO TBCR
40.0000 meq | EXTENDED_RELEASE_TABLET | Freq: Once | ORAL | Status: AC
  Administered 2014-06-27: 40 meq via ORAL
  Filled 2014-06-27: qty 2

## 2014-06-27 MED ORDER — RIVAROXABAN (XARELTO) EDUCATION KIT FOR DVT/PE PATIENTS
PACK | Freq: Once | Status: DC
  Filled 2014-06-27: qty 1

## 2014-06-27 NOTE — Progress Notes (Signed)
UR Completed.  [phone]  

## 2014-06-27 NOTE — Progress Notes (Signed)
Discontinued Cardizem IV & started Heparin at 12 ml/hr per order. Patient bradycardic HR in the 50's, patient is asymptomatic, denies chest pain or SOB, VSS. MD notified. Orders given to do STAT EKG, order followed through. RN will continue to monitor.  Toya SmothersLy Jevon Littlepage, RN

## 2014-06-27 NOTE — Care Management Note (Signed)
    Page 1 of 1   06/27/2014     12:12:53 PM CARE MANAGEMENT NOTE 06/27/2014  Patient:  Lacey Tran,Lacey Tran   Account Number:  [id number]  Date Initiated:  06/27/2014  Documentation initiated by:  Donn PieriniWEBSTER,Rumaysa Sabatino  Subjective/Objective Assessment:   Pt admitted with new afib, PNA     Action/Plan:   PTA pt lived at home   Anticipated DC Date:  06/29/2014   Anticipated DC Plan:  HOME/SELF CARE      DC Planning Services  CM consult  Medication Assistance      Choice offered to / List presented to:             Status of service:  In process, will continue to follow Medicare Important Message given?   (If response is "NO", the following Medicare IM given date fields will be blank) Date Medicare IM given:   Medicare IM given by:   Date Additional Medicare IM given:   Additional Medicare IM given by:    Discharge Disposition:    Per UR Regulation:  Reviewed for med. necessity/level of care/duration of stay  If discussed at Long Length of Stay Meetings, dates discussed:    Comments:  06/27/14- 1200- Donn PieriniKristi Nariyah Osias RN, BSN [phone] Referral for Xarelto benefits check- Xarelto is covered- PT COPAY WILL BE $45- PRIOR AUTH IS REQUIRED [phone]- MD notified to call for pre-auth- will give pt 30 day free card.

## 2014-06-27 NOTE — Consult Note (Addendum)
Reason for Consult: atrial fib   Referring Physician: Dr. Ree Kida   PCP:  Manon Hilding, MD  Primary Cardiologist:  Lacey Tran is an 69 y.o. female.    Chief Complaint: SOB   HPI: 69 y.o. female with Past medical history of hypertension, no CAD and no arrhthymias presented to with complaints of shortness of breath. She mentioned that since last 2-3 weeks she has been having productive cough. She was initially treated as bronchitis when she has seen by her PCP. She has received a steroid injection and Tessalon Perles for symptomatic treatment. Despite this her cough has not improved and she progressively has worsening cough. To the extent that she occasionally has some abdominal pain because of the cough.  She also started having fever.  Over last 2 days, prior to admit, she has episodes of shortness of breath to the extent she would be choking and gasping for air.  Dx'd with CAP RLL and Azithromycin/ceftriaxone have been started.    Initially she was found in [place] she did have ST depression but later that evening went into a fib with RVR with ST depression through out. .  By 0700 she had converted back to SR-SB with flipped t wave in III.Marland Kitchen Her CHA2DSS2-VASc is 3- high risk  She has been started on Xarelto- was on IV heparin previously.   Awaiting echo.  Her troponins negative, and her K+ on admit 2.7 ( on HCTZ at home)  Today back to 3.5.  TSH is normal, neg flu screens.    EKG with t wave inversions in lead III.  Previous EKGs in a fib. ST depression in II, V2-6.  She had no chest pain.  No hx stroke, thyroid disease or diabetes.  No hx. CAD.     Past Medical History  Diagnosis Date  . Hypertension     History reviewed. No pertinent past surgical history.  Family History  Problem Relation Age of Onset  . Cancer Mother   . Cancer Father   . Healthy Sister   . Healthy Brother   . Healthy Sister    Social History:  reports that she has never smoked. She has never  used smokeless tobacco. She reports that she does not drink alcohol or use illicit drugs.  Married with 4 children all vaginal delivery  Allergies: No Known Allergies  Medications Prior to Admission  Medication Sig Dispense Refill  . benzonatate (TESSALON) 100 MG capsule Take 100 mg by mouth 3 (three) times daily as needed for cough.    . hydrochlorothiazide (HYDRODIURIL) 25 MG tablet Take 25 mg by mouth daily.    . potassium chloride (K-DUR,KLOR-CON) 10 MEQ tablet Take 10 mEq by mouth daily.      Results for orders placed or performed during the hospital encounter of 06/26/14 (from the past 48 hour(s))  CBC with Differential     Status: None   Collection Time: 06/26/14  6:02 PM  Result Value Ref Range   WBC 6.9 4.0 - 10.5 K/uL   RBC 4.30 3.87 - 5.11 MIL/uL   Hemoglobin 13.2 12.0 - 15.0 g/dL   HCT 38.4 36.0 - 46.0 %   MCV 89.3 78.0 - 100.0 fL   MCH 30.7 26.0 - 34.0 pg   MCHC 34.4 30.0 - 36.0 g/dL   RDW 13.5 11.5 - 15.5 %   Platelets 266 150 - 400 K/uL   Neutrophils Relative % 70 43 - 77 %  Neutro Abs 4.8 1.7 - 7.7 K/uL   Lymphocytes Relative 21 12 - 46 %   Lymphs Abs 1.5 0.7 - 4.0 K/uL   Monocytes Relative 9 3 - 12 %   Monocytes Absolute 0.6 0.1 - 1.0 K/uL   Eosinophils Relative 0 0 - 5 %   Eosinophils Absolute 0.0 0.0 - 0.7 K/uL   Basophils Relative 0 0 - 1 %   Basophils Absolute 0.0 0.0 - 0.1 K/uL  Basic metabolic panel     Status: Abnormal   Collection Time: 06/26/14  6:02 PM  Result Value Ref Range   Sodium 136 135 - 145 mmol/L   Potassium 2.7 (LL) 3.5 - 5.1 mmol/L    Comment: REPEATED TO VERIFY CRITICAL RESULT CALLED TO, READ BACK BY AND VERIFIED WITH: N STEVENS,RN 1912 06/26/14 WBOND    Chloride 98 96 - 112 mmol/L   CO2 25 19 - 32 mmol/L   Glucose, Bld 132 (H) 70 - 99 mg/dL   BUN 20 6 - 23 mg/dL   Creatinine, Ser 1.01 0.50 - 1.10 mg/dL   Calcium 9.5 8.4 - 10.5 mg/dL   GFR calc non Af Amer 55 (L) >90 mL/min   GFR calc Af Amer 64 (L) >90 mL/min    Comment:  (NOTE) The eGFR has been calculated using the CKD EPI equation. This calculation has not been validated in all clinical situations. eGFR's persistently <90 mL/min signify possible Chronic Kidney Disease.    Anion gap 13 5 - 15  Troponin I     Status: None   Collection Time: 06/26/14  6:02 PM  Result Value Ref Range   Troponin I <0.03 <0.031 ng/mL    Comment:        NO INDICATION OF MYOCARDIAL INJURY.   Magnesium     Status: None   Collection Time: 06/26/14  9:07 PM  Result Value Ref Range   Magnesium 1.9 1.5 - 2.5 mg/dL  Troponin I (q 6hr x 3)     Status: None   Collection Time: 06/26/14  9:07 PM  Result Value Ref Range   Troponin I <0.03 <0.031 ng/mL    Comment:        NO INDICATION OF MYOCARDIAL INJURY.   Influenza panel by PCR (type A & B, H1N1)     Status: None   Collection Time: 06/26/14 11:52 PM  Result Value Ref Range   Influenza A By PCR NEGATIVE NEGATIVE   Influenza B By PCR NEGATIVE NEGATIVE   H1N1 flu by pcr NOT DETECTED NOT DETECTED    Comment:        The Xpert Flu assay (FDA approved for nasal aspirates or washes and nasopharyngeal swab specimens), is intended as an aid in the diagnosis of influenza and should not be used as a sole basis for treatment.   Troponin I (q 6hr x 3)     Status: None   Collection Time: 06/27/14  2:54 AM  Result Value Ref Range   Troponin I <0.03 <0.031 ng/mL    Comment:        NO INDICATION OF MYOCARDIAL INJURY.   CBC     Status: Abnormal   Collection Time: 06/27/14  2:54 AM  Result Value Ref Range   WBC 6.2 4.0 - 10.5 K/uL   RBC 3.82 (L) 3.87 - 5.11 MIL/uL   Hemoglobin 11.5 (L) 12.0 - 15.0 g/dL   HCT 34.8 (L) 36.0 - 46.0 %   MCV 91.1 78.0 - 100.0 fL  MCH 30.1 26.0 - 34.0 pg   MCHC 33.0 30.0 - 36.0 g/dL   RDW 13.9 11.5 - 15.5 %   Platelets 224 150 - 400 K/uL  Culture, sputum-assessment     Status: None   Collection Time: 06/27/14  3:54 AM  Result Value Ref Range   Specimen Description SPUTUM    Special Requests  NONE    Sputum evaluation      THIS SPECIMEN IS ACCEPTABLE. RESPIRATORY CULTURE REPORT TO FOLLOW.   Report Status 06/27/2014 FINAL   Culture, respiratory (NON-Expectorated)     Status: None (Preliminary result)   Collection Time: 06/27/14  3:54 AM  Result Value Ref Range   Specimen Description SPUTUM    Special Requests NONE    Gram Stain      MODERATE WBC PRESENT,BOTH PMN AND MONONUCLEAR RARE SQUAMOUS EPITHELIAL CELLS PRESENT FEW GRAM POSITIVE COCCI IN PAIRS IN CLUSTERS RARE GRAM NEGATIVE RODS Performed at [facility]    Culture PENDING    Report Status PENDING   Heparin level (unfractionated)     Status: Abnormal   Collection Time: 06/27/14  7:23 AM  Result Value Ref Range   Heparin Unfractionated 1.20 (H) 0.30 - 0.70 IU/mL    Comment: RESULTS CONFIRMED BY MANUAL DILUTION        IF HEPARIN RESULTS ARE BELOW EXPECTED VALUES, AND PATIENT DOSAGE HAS BEEN CONFIRMED, SUGGEST FOLLOW UP TESTING OF ANTITHROMBIN III LEVELS.   Troponin I (q 6hr x 3)     Status: None   Collection Time: 06/27/14  9:10 AM  Result Value Ref Range   Troponin I <0.03 <0.031 ng/mL    Comment:        NO INDICATION OF MYOCARDIAL INJURY.   Comprehensive metabolic panel     Status: Abnormal   Collection Time: 06/27/14  9:10 AM  Result Value Ref Range   Sodium 140 135 - 145 mmol/L   Potassium 3.5 3.5 - 5.1 mmol/L   Chloride 106 96 - 112 mmol/L   CO2 28 19 - 32 mmol/L   Glucose, Bld 97 70 - 99 mg/dL   BUN 13 6 - 23 mg/dL   Creatinine, Ser 0.78 0.50 - 1.10 mg/dL   Calcium 8.6 8.4 - 10.5 mg/dL   Total Protein 7.1 6.0 - 8.3 g/dL   Albumin 3.1 (L) 3.5 - 5.2 g/dL   AST 27 0 - 37 U/L   ALT 17 0 - 35 U/L   Alkaline Phosphatase 55 39 - 117 U/L   Total Bilirubin 0.7 0.3 - 1.2 mg/dL   GFR calc non Af Amer 83 (L) >90 mL/min   GFR calc Af Amer >90 >90 mL/min    Comment: (NOTE) The eGFR has been calculated using the CKD EPI equation. This calculation has not been validated in all clinical  situations. eGFR's persistently <90 mL/min signify possible Chronic Kidney Disease.    Anion gap 6 5 - 15  Magnesium     Status: None   Collection Time: 06/27/14  9:10 AM  Result Value Ref Range   Magnesium 1.9 1.5 - 2.5 mg/dL  TSH     Status: None   Collection Time: 06/27/14  9:10 AM  Result Value Ref Range   TSH 3.867 0.350 - 4.500 uIU/mL   Dg Chest 2 View  06/26/2014   CLINICAL DATA:  69 year old female with a 2 week history of cough, congestion and bronchitis.  EXAM: CHEST  2 VIEW  COMPARISON:  None.  FINDINGS: Focal patchy airspace  opacity in the posterolateral right lower lobe. Nonspecific central bronchitic change and coarsening of the interstitial markings may be chronic. Cardiac and mediastinal contours are within normal limits. No pleural effusion, pulmonary edema or pneumothorax. No acute osseous abnormality.  IMPRESSION: 1. Patchy airspace opacity in the posterolateral right lower lobe may concerning for early bronchopneumonia. 2. Central bronchitic change and interstitial coarsening are favored to reflect chronic parenchymal changes. However, without prior imaging for comparison an acute atypical infectious process is difficult to exclude entirely.   Electronically Signed   By: Jacqulynn Cadet M.D.   On: 06/26/2014 18:51    ROS: General:+ colds + fevers, no weight changes Skin:no rashes or ulcers HEENT:no blurred vision, no congestion CV:see HPI PUL:see HPI GI:no diarrhea constipation or melena, no indigestion GU:no hematuria, no dysuria MS:no joint pain, no claudication Neuro:no syncope, no lightheadedness Endo:no diabetes, no thyroid disease   Blood pressure 119/64, pulse 66, temperature 98.1 F (36.7 C), temperature source Oral, resp. rate 16, height '5\' 6"'  (1.676 m), weight 152 lb (68.947 kg), SpO2 96 %.  Wt Readings from Last 3 Encounters:  06/27/14 152 lb (68.947 kg)    PE: General:Pleasant affect, NAD, she walks some for exercise and does allof her housework  with vacuuming etc. Skin:Warm and dry, brisk capillary refill HEENT:normocephalic, sclera clear, mucus membranes moist Neck:supple, no JVD, no bruits, no adenopathy  Heart:S1S2 RRR without murmur, gallup, rub or click Lungs:clear to diminishedwithout rales, rhonchi, or wheezes FXT:KWIO, non tender, + BS, do not palpate liver spleen or masses Ext:no lower ext edema, 2+ pedal pulses, 2+ radial pulses Neuro:alert and oriented X 3, MAE, follows commands, + facial symmetry  tele:  SR   Assessment/Plan Principal Problem:   CAP (community acquired pneumonia) on ABX per IM Active Problems:   Atrial fibrillation with RVR, CHA2DS2-VASc Score 3, female, age >74, HTN- with IV heparin xarelto crossover- see MD note --on dilt 30 mg QID.  Could change to po 120 mg CD tomorrow.  Will arrange for outpt event monitor for 30 days.   Abnormal EKG- do lexiscan myoivew before discharge.    Hypertension, essential- controlled.   Hypokalemia- improved was 2.7    [facility]  Nurse Practitioner Certified [place] Pager [phone] or after 5pm or weekends call [phone] 06/27/2014, 2:55 PM  Patient seen and examined with Lacey Kicks, NP-C. We discussed all aspects of the encounter. I agree with the assessment and plan as stated above.   69 y/o woman with limited PMHx (just HTN) admitted with PNA. Today had brief episode of AF that was asx. Resolved spontaneously. Denies any h/o palpitations or other heart problems. ECG during episode was markedly abnormal. CHADSVASC 3 (age,female, HTN).  Given one episode of AF in setting of PN would not commit her to long-term AC at this point as AF (unless LA is very large)  likely precipitated by PNA. Would place event monitor at discharge and if recurrent AF then would anti-coagulate. I discussed this with her and her family at length and offered them possibility of starting [facility] now but they prefer to defer. Given markedly abnormal ECG with AF would  do lexiscan myoview prior to d/c.   Orlanda Frankum,MD 3:26 PM

## 2014-06-27 NOTE — Consult Note (Signed)
ANTICOAGULATION CONSULT NOTE - Follow Up Consult  Pharmacy Consult for Heparin Indication: atrial fibrillation  No Known Allergies  Patient Measurements: Height: 5\' 6"  (167.6 cm) Weight: 152 lb (68.947 kg) IBW/kg (Calculated) : 59.3 Heparin Dosing Weight: 68kg  Vital Signs: Temp: 98 F (36.7 C) (01/29 0300) Temp Source: Oral (01/29 0300) BP: 112/66 mmHg (01/29 0300) Pulse Rate: 56 (01/29 0300)  Labs:  Recent Labs  06/26/14 1802 06/26/14 2107 06/27/14 0254  HGB 13.2  --  11.5*  HCT 38.4  --  34.8*  PLT 266  --  224  CREATININE 1.01  --   --   TROPONINI <0.03 <0.03 <0.03    Estimated Creatinine Clearance: 49.2 mL/min (by C-G formula based on Cr of 1.01).   Medical History: Past Medical History  Diagnosis Date  . Hypertension     Medications:  No anticoagulants pta  Assessment: 68yof presents to the ED with SOB - was diagnosed with bronchitis this past Tuesday. While in the ED she developed afib RVR (chadsvasc = 3). She will begin IV heparin. Baseline renal function and CBC wnl.  Initial heparin level is SUPRAtherapeutic at 1.2 on heparin 1200 units/hr. No bleeding is noted.  Goal of Therapy:  Heparin level 0.3-0.7 units/ml Monitor platelets by anticoagulation protocol: Yes   Plan:   Turn off heparin for 1 hour  Decrease heparin drip to 950 units/hr  Check 8 hour heparin level  Daily heparin level and CBC  Arlean Hoppingorey M. Newman PiesBall, PharmD Clinical Pharmacist Pager [phone] 06/27/2014,8:09 AM

## 2014-06-27 NOTE — Progress Notes (Signed)
[facility]                                                                              Patient Demographics  Lacey Tran, is a 69 y.o. female, DOB - [birth year], ZOX:[id number]  Admit date - 06/26/2014   Admitting Physician Lynden OxfordPranav Patel, MD  Outpatient Primary MD for the patient is Estanislado PandySASSER,PAUL W, MD  LOS - 1   Chief Complaint  Patient presents with  . Shortness of Breath      HPI on 06/27/2014 by Dr. Lynden OxfordPranav Patel Lacey Tran is a 69 y.o. female with Past medical history of hypertension. The patient presented with complaints of shortness of breath. She mentions that since last 2-3 weeks she has been having productive cough. She was initially treated as bronchitis when she has seen by her PCP. She has received a steroid injection and Tessalon Perles for symptomatic treatment. Despite this her cough has not improved and she progressively has worsening cough. To the extent that she occasionally has some abdominal pain because of the cough. She also started having fever. Over last 2 days that she has episodes of shortness of breath to the extent she would be choking and gasping for air. Due to one of her episode today she was brought here to the ER. Patient denies any complaints of chest pain or chest tightness. She complains of dizziness and lightheadedness with poor oral intake. No prior history of bleeding or surgery or head injury. She has a history of high blood pressure and has been taking hydrochlorothiazide and has been having difficulty maintaining her potassium level.  The patient is coming from home And at her baseline independent for most of her ADL.  Assessment & Plan   Community Acquired Pneumonia -CXR: The airspace opacity in the posterior lateral right lower lobe -Continue antibiotics: Azithromycin/ceftriaxone -Blood cultures pending, urine antigens pending  New Atrial fibrillation with RVR -Patient has converted back to sinus rhythm -CHADSVasc 3 -Continue heparin,  will likely transition to xarelto -Pending Echocardiogram -Cardiology consulted and appreciated -troponins cycled and negative -TSH 3.867 -Magnesium 1.9 -Potassium 2.7 upon admission, replaced currently 3.5 (will continue to replace.    Shortness of breath  -Secondary to multifactorial causes: Afib vs CAP -Improving -Treatment and plan as above  Hypokalemia -likely secondary to HCTZ -Will continue to monitor and replace as needed  Hypertension -HCTZ held -BP stable  Code Status: Full  Family Communication: Husband at bedside  Disposition Plan: Admitted  Time Spent in minutes   30 minutes  Procedures  Echocardiogram  Consults   Cardiology  DVT Prophylaxis  Heparin  Lab Results  Component Value Date   PLT 224 06/27/2014    Medications  Scheduled Meds: . azithromycin  500 mg Intravenous Q24H  . cefTRIAXone (ROCEPHIN)  IV  1 g Intravenous Q24H  . diltiazem  30 mg Oral 4 times per day  . guaiFENesin  600 mg Oral BID   Continuous Infusions: . sodium chloride 100 mL/hr at 06/26/14 2316  . heparin     PRN Meds:.  Antibiotics    Anti-infectives    Start     Dose/Rate Route Frequency Ordered Stop   06/27/14 2036  azithromycin (ZITHROMAX) 500 mg in dextrose 5 % 250 mL IVPB     500 mg250 mL/hr over 60 Minutes Intravenous Every 24 hours 06/26/14 2122 07/03/14 2044   06/26/14 2115  cefTRIAXone (ROCEPHIN) 1 g in dextrose 5 % 50 mL IVPB     1 g100 mL/hr over 30 Minutes Intravenous Every 24 hours 06/26/14 2105 07/03/14 2114   06/26/14 2115  azithromycin (ZITHROMAX) 500 mg in dextrose 5 % 250 mL IVPB  Status:  Discontinued     500 mg250 mL/hr over 60 Minutes Intravenous Every 24 hours 06/26/14 2105 06/26/14 2122   06/26/14 1930  cefTRIAXone (ROCEPHIN) 1 g in dextrose 5 % 50 mL IVPB     1 g100 mL/hr over 30 Minutes Intravenous  Once 06/26/14 1925 06/26/14 2030   06/26/14 1930  azithromycin (ZITHROMAX) 500 mg in dextrose 5 % 250 mL IVPB     500 mg250 mL/hr over 60  Minutes Intravenous  Once 06/26/14 1925 06/26/14 2136        Subjective:   Lacey Tran seen and examined today.  Patient feels her breathing has improved.  She states she was told she had bronchitis by her PCP a fews ago.  She denies chest pain or palpitations at this time.  Objective:   Filed Vitals:   06/26/14 2100 06/26/14 2123 06/27/14 0300 06/27/14 0500  BP: 128/81 114/80 112/66   Pulse: 84 80 56   Temp:  98.1 F (36.7 C) 98 F (36.7 C)   TempSrc:  Oral Oral   Resp: Height:   (1.676 m)    Weight:  68.947 kg (152 lb)  68.947 kg (152 lb)  SpO2: 96% 97% 100%     Wt Readings from Last 3 Encounters:  06/27/14 68.947 kg (152 lb)     Intake/Output Summary (Last 24 hours) at 06/27/14 1045 Last data filed at 06/27/14 0700  Gross per 24 hour  Intake      0 ml  Output      0 ml  Net      0 ml    Exam  General: Well developed, well nourished, NAD, appears stated age  HEENT: NCAT,  mucous membranes moist.   Cardiovascular: S1 S2 auscultated, no rubs, murmurs or gallops. Regular rate and rhythm.  Respiratory: Crackles noted in RLL.  No wheezing.  Abdomen: Soft, nontender, nondistended, + bowel sounds  Extremities: warm dry without cyanosis clubbing or edema  Neuro: AAOx3, nonfocal  Psych: Normal affect and demeanor with intact judgement and insight  Data Review   Micro Results Recent Results (from the past 240 hour(s))  Culture, sputum-assessment     Status: None   Collection Time: 06/27/14  3:54 AM  Result Value Ref Range Status   Specimen Description SPUTUM  Final   Special Requests NONE  Final   Sputum evaluation   Final    THIS SPECIMEN IS ACCEPTABLE. RESPIRATORY CULTURE REPORT TO FOLLOW.   Report Status 06/27/2014 FINAL  Final    Radiology Reports Dg Chest 2 View  06/26/2014   CLINICAL DATA:  69 year old female with a 2 week history of cough, congestion and bronchitis.  EXAM: CHEST  2 VIEW  COMPARISON:  None.  FINDINGS: Focal  patchy airspace opacity in the posterolateral right lower lobe. Nonspecific central bronchitic change and coarsening of the interstitial markings may be chronic. Cardiac and mediastinal contours are within normal limits. No pleural effusion, pulmonary edema or pneumothorax. No acute osseous abnormality.  IMPRESSION:  1. Patchy airspace opacity in the posterolateral right lower lobe may concerning for early bronchopneumonia. 2. Central bronchitic change and interstitial coarsening are favored to reflect chronic parenchymal changes. However, without prior imaging for comparison an acute atypical infectious process is difficult to exclude entirely.   Electronically Signed   By: Malachy Moan M.D.   On: 06/26/2014 18:51    CBC  Recent Labs Lab 06/26/14 1802 06/27/14 0254  WBC 6.9 6.2  HGB 13.2 11.5*  HCT 38.4 34.8*  PLT 266 224  MCV 89.3 91.1  MCH 30.7 30.1  MCHC 34.4 33.0  RDW 13.5 13.9  LYMPHSABS 1.5  --   MONOABS 0.6  --   EOSABS 0.0  --   BASOSABS 0.0  --     Chemistries   Recent Labs Lab 06/26/14 1802 06/26/14 2107 06/27/14 0910  NA 136  --  140  K 2.7*  --  3.5  CL 98  --  106  CO2 25  --  28  GLUCOSE 132*  --  97  BUN 20  --  13  CREATININE 1.01  --  0.78  CALCIUM 9.5  --  8.6  MG  --  1.9 1.9  AST  --   --  27  ALT  --   --  17  ALKPHOS  --   --  55  BILITOT  --   --  0.7   ------------------------------------------------------------------------------------------------------------------ estimated creatinine clearance is 62.1 mL/min (by C-G formula based on Cr of 0.78). ------------------------------------------------------------------------------------------------------------------ No results for input(s): HGBA1C in the last 72 hours. ------------------------------------------------------------------------------------------------------------------ No results for input(s): CHOL, HDL, LDLCALC, TRIG, CHOLHDL, LDLDIRECT in the last 72  hours. ------------------------------------------------------------------------------------------------------------------ No results for input(s): TSH, T4TOTAL, T3FREE, THYROIDAB in the last 72 hours.  Invalid input(s): FREET3 ------------------------------------------------------------------------------------------------------------------ No results for input(s): VITAMINB12, FOLATE, FERRITIN, TIBC, IRON, RETICCTPCT in the last 72 hours.  Coagulation profile No results for input(s): INR, PROTIME in the last 168 hours.  No results for input(s): DDIMER in the last 72 hours.  Cardiac Enzymes  Recent Labs Lab 06/26/14 2107 06/27/14 0254 06/27/14 0910  TROPONINI <0.03 <0.03 <0.03   ------------------------------------------------------------------------------------------------------------------ Invalid input(s): POCBNP    Lissete Maestas D.O. on 06/27/2014 at 10:45 AM  Between 7am to 7pm - Pager - [phone]  After 7pm go to [email or web address] - password [email or web address]  And look for the night coverage person covering for me after hours  [facility] Office  [phone]

## 2014-06-27 NOTE — Consult Note (Signed)
ANTICOAGULATION CONSULT NOTE - Follow Up Consult  Pharmacy Consult for Xarelto Indication: atrial fibrillation  No Known Allergies  Patient Measurements: Height: 5\' 6"  (167.6 cm) Weight: 152 lb (68.947 kg) IBW/kg (Calculated) : 59.3 Heparin Dosing Weight: 68kg  Vital Signs: Temp: 98 F (36.7 C) (01/29 0300) Temp Source: Oral (01/29 0300) BP: 112/66 mmHg (01/29 0300) Pulse Rate: 56 (01/29 0300)  Labs:  Recent Labs  06/26/14 1802 06/26/14 2107 06/27/14 0254 06/27/14 0723 06/27/14 0910  HGB 13.2  --  11.5*  --   --   HCT 38.4  --  34.8*  --   --   PLT 266  --  224  --   --   HEPARINUNFRC  --   --   --  1.20*  --   CREATININE 1.01  --   --   --  0.78  TROPONINI <0.03 <0.03 <0.03  --  <0.03    Estimated Creatinine Clearance: 62.1 mL/min (by C-G formula based on Cr of 0.78).   Medical History: Past Medical History  Diagnosis Date  . Hypertension     Medications:  No anticoagulants pta  Assessment: 68yof presents to the ED with SOB - was diagnosed with bronchitis this past Tuesday. While in the ED she developed afib RVR (chadsvasc = 3). She will begin IV heparin. Baseline renal function and CBC wnl.  Initial heparin level is SUPRAtherapeutic at 1.2 on heparin 1200 units/hr. No bleeding is noted.  Pharmacy is consulted to transition heparin to xarelto for atrial fibrillation. Hgb 11.5, Plt 224, sCr 0.78 with CrCl   Goal of Therapy:  Heparin level 0.3-0.7 units/ml Monitor platelets by anticoagulation protocol: Yes   Plan:   Turn off heparin   Start Xarelto 20mg  PO daily  Daily CBC  Monitor s/sx of bleeding  Educate patient on Xarelto  Arlean HoppingCorey M. Newman PiesBall, PharmD Clinical Pharmacist Pager [phone] 06/27/2014,10:54 AM

## 2014-06-28 ENCOUNTER — Inpatient Hospital Stay (HOSPITAL_COMMUNITY): Payer: Medicare Other

## 2014-06-28 DIAGNOSIS — R9431 Abnormal electrocardiogram [ECG] [EKG]: Secondary | ICD-10-CM

## 2014-06-28 LAB — BASIC METABOLIC PANEL
ANION GAP: 2 — AB (ref 5–15)
BUN: 11 mg/dL (ref 6–23)
CO2: 25 mmol/L (ref 19–32)
Calcium: 8.3 mg/dL — ABNORMAL LOW (ref 8.4–10.5)
Chloride: 112 mmol/L (ref 96–112)
Creatinine, Ser: 0.79 mg/dL (ref 0.50–1.10)
GFR calc non Af Amer: 83 mL/min — ABNORMAL LOW (ref >90)
GLUCOSE: 105 mg/dL — AB (ref 70–99)
Potassium: 3.9 mmol/L (ref 3.5–5.1)
Sodium: 139 mmol/L (ref 135–145)

## 2014-06-28 LAB — IRON AND TIBC
Iron: 59 ug/dL (ref 42–145)
Saturation Ratios: 24 % (ref 20–55)
TIBC: 242 ug/dL — ABNORMAL LOW (ref 250–470)
UIBC: 183 ug/dL (ref 125–400)

## 2014-06-28 LAB — RETICULOCYTES
RBC.: 3.77 MIL/uL — AB (ref 3.87–5.11)
RETIC COUNT ABSOLUTE: 56.6 10*3/uL (ref 19.0–186.0)
RETIC CT PCT: 1.5 % (ref 0.4–3.1)

## 2014-06-28 LAB — CBC
HEMATOCRIT: 31.5 % — AB (ref 36.0–46.0)
Hemoglobin: 10.4 g/dL — ABNORMAL LOW (ref 12.0–15.0)
MCH: 30.2 pg (ref 26.0–34.0)
MCHC: 33 g/dL (ref 30.0–36.0)
MCV: 91.6 fL (ref 78.0–100.0)
Platelets: 205 10*3/uL (ref 150–400)
RBC: 3.44 MIL/uL — ABNORMAL LOW (ref 3.87–5.11)
RDW: 14.3 % (ref 11.5–15.5)
WBC: 4.9 10*3/uL (ref 4.0–10.5)

## 2014-06-28 MED ORDER — REGADENOSON 0.4 MG/5ML IV SOLN
0.4000 mg | Freq: Once | INTRAVENOUS | Status: AC
  Administered 2014-06-28: 0.4 mg via INTRAVENOUS
  Filled 2014-06-28: qty 5

## 2014-06-28 MED ORDER — TECHNETIUM TC 99M SESTAMIBI GENERIC - CARDIOLITE
30.0000 | Freq: Once | INTRAVENOUS | Status: AC | PRN
  Administered 2014-06-28: 30 via INTRAVENOUS

## 2014-06-28 MED ORDER — TECHNETIUM TC 99M SESTAMIBI GENERIC - CARDIOLITE
10.0000 | Freq: Once | INTRAVENOUS | Status: AC | PRN
  Administered 2014-06-28: 10 via INTRAVENOUS

## 2014-06-28 MED ORDER — REGADENOSON 0.4 MG/5ML IV SOLN
INTRAVENOUS | Status: AC
  Administered 2014-06-28: 0.4 mg via INTRAVENOUS
  Filled 2014-06-28: qty 5

## 2014-06-28 NOTE — Progress Notes (Signed)
[facility]                                                                              Patient Demographics  Lacey Tran, is a 69 y.o. female, DOB - [birth year], YNW:[id number]  Admit date - 06/26/2014   Admitting Physician Lynden OxfordPranav Patel, MD  Outpatient Primary MD for the patient is Estanislado PandySASSER,PAUL W, MD  LOS - 2   Chief Complaint  Patient presents with  . Shortness of Breath      HPI on 06/27/2014 by Dr. Lynden OxfordPranav Patel Lacey Tran is a 69 y.o. female with Past medical history of hypertension. The patient presented with complaints of shortness of breath. She mentions that since last 2-3 weeks she has been having productive cough. She was initially treated as bronchitis when she has seen by her PCP. She has received a steroid injection and Tessalon Perles for symptomatic treatment. Despite this her cough has not improved and she progressively has worsening cough. To the extent that she occasionally has some abdominal pain because of the cough. She also started having fever. Over last 2 days that she has episodes of shortness of breath to the extent she would be choking and gasping for air. Due to one of her episode today she was brought here to the ER. Patient denies any complaints of chest pain or chest tightness. She complains of dizziness and lightheadedness with poor oral intake. No prior history of bleeding or surgery or head injury. She has a history of high blood pressure and has been taking hydrochlorothiazide and has been having difficulty maintaining her potassium level.  The patient is coming from home And at her baseline independent for most of her ADL.  Assessment & Plan   Community Acquired Pneumonia -CXR: The airspace opacity in the posterior lateral right lower lobe -Continue antibiotics: Azithromycin/ceftriaxone -Blood cultures pending, urine antigens pending -Sputum culture: mod WBC, few GPC, rare GNR -influenza negative  New Atrial fibrillation with RVR -Patient  has converted back to sinus rhythm -CHADSVasc 3 -Initially started on heparin and discontinued -Continue cardizem PO -Cardiology consulted and appreciated and recommended holding anticoagulation for now (patient request) and if Afib occurs again-would anticoagulate -troponins cycled and negative -TSH 3.867 -Magnesium 1.9 -Potassium 2.7 upon admission, replaced currently 3.9  Abnormal EKG -Troponins negative -Patient chest pain free -Lexiscan pending  Shortness of breath  -Secondary to multifactorial causes: Afib vs CAP -Improving -Treatment and plan as above  Hypokalemia -likely secondary to HCTZ -Will continue to monitor and replace as needed  Hypertension -HCTZ held -BP stable  Normocytic Anemia -Drop in Hb likely dilutional -Continue to monitor CBC  Code Status: Full  Family Communication: Husband at bedside  Disposition Plan: Admitted  Time Spent in minutes   30 minutes  Procedures  Echocardiogram Lexiscan  Consults   Cardiology  DVT Prophylaxis  Heparin  Lab Results  Component Value Date   PLT 205 06/28/2014    Medications  Scheduled Meds: . azithromycin  500 mg Intravenous Q24H  . cefTRIAXone (ROCEPHIN)  IV  1 g Intravenous Q24H  . diltiazem  120 mg Oral Daily  . guaiFENesin  600 mg Oral BID   Continuous Infusions: . sodium  chloride 100 mL/hr at 06/27/14 2029   PRN Meds:.  Antibiotics    Anti-infectives    Start     Dose/Rate Route Frequency Ordered Stop   06/27/14 2036  azithromycin (ZITHROMAX) 500 mg in dextrose 5 % 250 mL IVPB     500 mg250 mL/hr over 60 Minutes Intravenous Every 24 hours 06/26/14 2122 07/03/14 2044   06/26/14 2115  cefTRIAXone (ROCEPHIN) 1 g in dextrose 5 % 50 mL IVPB     1 g100 mL/hr over 30 Minutes Intravenous Every 24 hours 06/26/14 2105 07/03/14 2114   06/26/14 2115  azithromycin (ZITHROMAX) 500 mg in dextrose 5 % 250 mL IVPB  Status:  Discontinued     500 mg250 mL/hr over 60 Minutes Intravenous Every 24 hours  06/26/14 2105 06/26/14 2122   06/26/14 1930  cefTRIAXone (ROCEPHIN) 1 g in dextrose 5 % 50 mL IVPB     1 g100 mL/hr over 30 Minutes Intravenous  Once 06/26/14 1925 06/26/14 2030   06/26/14 1930  azithromycin (ZITHROMAX) 500 mg in dextrose 5 % 250 mL IVPB     500 mg250 mL/hr over 60 Minutes Intravenous  Once 06/26/14 1925 06/26/14 2136        Subjective:   Lacey Tran seen and examined today.  Patient feels her breathing has improved, but she continues to have productive cough.  She denies chest pain, abdominal pain, headache, dizziness.  Objective:   Filed Vitals:   06/28/14 0925 06/28/14 1003 06/28/14 1005 06/28/14 1007  BP: 127/81 130/84 143/71 143/71  Pulse: 59     Temp:      TempSrc:      Resp: Height:      Weight:      SpO2:        Wt Readings from Last 3 Encounters:  06/28/14 68.9 kg (151 lb 14.4 oz)     Intake/Output Summary (Last 24 hours) at 06/28/14 1018 Last data filed at 06/28/14 1014  Gross per 24 hour  Intake    120 ml  Output      0 ml  Net    120 ml    Exam  General: Well developed, well nourished, NAD, appears stated age  HEENT: NCAT,  mucous membranes moist.   Cardiovascular: S1 S2 auscultated, no rubs, murmurs or gallops. Regular rate and rhythm.  Respiratory: diminished breath sounds, mild crackles  Abdomen: Soft, nontender, nondistended, + bowel sounds  Extremities: warm dry without cyanosis clubbing or edema  Neuro: AAOx3, nonfocal  Psych: Normal affect and demeanor with intact judgement and insight  Data Review   Micro Results Recent Results (from the past 240 hour(s))  Culture, sputum-assessment     Status: None   Collection Time: 06/27/14  3:54 AM  Result Value Ref Range Status   Specimen Description SPUTUM  Final   Special Requests NONE  Final   Sputum evaluation   Final    THIS SPECIMEN IS ACCEPTABLE. RESPIRATORY CULTURE REPORT TO FOLLOW.   Report Status 06/27/2014 FINAL  Final  Culture, respiratory  (NON-Expectorated)     Status: None (Preliminary result)   Collection Time: 06/27/14  3:54 AM  Result Value Ref Range Status   Specimen Description SPUTUM  Final   Special Requests NONE  Final   Gram Stain   Final    MODERATE WBC PRESENT,BOTH PMN AND MONONUCLEAR RARE SQUAMOUS EPITHELIAL CELLS PRESENT FEW GRAM POSITIVE COCCI IN PAIRS IN CLUSTERS RARE GRAM NEGATIVE RODS Performed at [facility]  Culture PENDING  Incomplete   Report Status PENDING  Incomplete    Radiology Reports Dg Chest 2 View  06/26/2014   CLINICAL DATA:  69 year old female with a 2 week history of cough, congestion and bronchitis.  EXAM: CHEST  2 VIEW  COMPARISON:  None.  FINDINGS: Focal patchy airspace opacity in the posterolateral right lower lobe. Nonspecific central bronchitic change and coarsening of the interstitial markings may be chronic. Cardiac and mediastinal contours are within normal limits. No pleural effusion, pulmonary edema or pneumothorax. No acute osseous abnormality.  IMPRESSION: 1. Patchy airspace opacity in the posterolateral right lower lobe may concerning for early bronchopneumonia. 2. Central bronchitic change and interstitial coarsening are favored to reflect chronic parenchymal changes. However, without prior imaging for comparison an acute atypical infectious process is difficult to exclude entirely.   Electronically Signed   By: Malachy Moan M.D.   On: 06/26/2014 18:51    CBC  Recent Labs Lab 06/26/14 1802 06/27/14 0254 06/28/14 0325  WBC 6.9 6.2 4.9  HGB 13.2 11.5* 10.4*  HCT 38.4 34.8* 31.5*  PLT 266 224 205  MCV 89.3 91.1 91.6  MCH 30.7 30.1 30.2  MCHC 34.4 33.0 33.0  RDW 13.5 13.9 14.3  LYMPHSABS 1.5  --   --   MONOABS 0.6  --   --   EOSABS 0.0  --   --   BASOSABS 0.0  --   --     Chemistries   Recent Labs Lab 06/26/14 1802 06/26/14 2107 06/27/14 0910 06/28/14 0325  NA 136  --  140 139  K 2.7*  --  3.5 3.9  CL 98  --  106 112  CO2 25  --  28 25    GLUCOSE 132*  --  97 105*  BUN 20  --  13 11  CREATININE 1.01  --  0.78 0.79  CALCIUM 9.5  --  8.6 8.3*  MG  --  1.9 1.9  --   AST  --   --  27  --   ALT  --   --  17  --   ALKPHOS  --   --  55  --   BILITOT  --   --  0.7  --    ------------------------------------------------------------------------------------------------------------------ estimated creatinine clearance is 62.1 mL/min (by C-G formula based on Cr of 0.79). ------------------------------------------------------------------------------------------------------------------ No results for input(s): HGBA1C in the last 72 hours. ------------------------------------------------------------------------------------------------------------------ No results for input(s): CHOL, HDL, LDLCALC, TRIG, CHOLHDL, LDLDIRECT in the last 72 hours. ------------------------------------------------------------------------------------------------------------------  Recent Labs  06/27/14 0910  TSH 3.867   ------------------------------------------------------------------------------------------------------------------ No results for input(s): VITAMINB12, FOLATE, FERRITIN, TIBC, IRON, RETICCTPCT in the last 72 hours.  Coagulation profile No results for input(s): INR, PROTIME in the last 168 hours.  No results for input(s): DDIMER in the last 72 hours.  Cardiac Enzymes  Recent Labs Lab 06/26/14 2107 06/27/14 0254 06/27/14 0910  TROPONINI <0.03 <0.03 <0.03   ------------------------------------------------------------------------------------------------------------------ Invalid input(s): POCBNP    Avry Roedl D.O. on 06/28/2014 at 10:18 AM  Between 7am to 7pm - Pager - [phone]  After 7pm go to [email or web address] - password [email or web address]  And look for the night coverage person covering for me after hours  [facility] Office  [phone]

## 2014-06-28 NOTE — Progress Notes (Signed)
Subjective: No SOB or CP.  Objective: Vital signs in last 24 hours: Temp:  [98.1 F (36.7 C)-98.2 F (36.8 C)] 98.2 F (36.8 C) (01/30 0528) Pulse Rate:  [57-66] 59 (01/30 0925) Resp:  [16-18] 18 (01/30 0925) BP: (106-127)/(60-81) 127/81 mmHg (01/30 0925) SpO2:  [96 %-99 %] 99 % (01/30 0528) Weight:  [151 lb 14.4 oz (68.9 kg)] 151 lb 14.4 oz (68.9 kg) (01/30 0528) Last BM Date: 06/26/14  Intake/Output from previous day: 01/29 0701 - 01/30 0700 In: 120 [P.O.:120] Out: -  Intake/Output this shift:    Medications Current Facility-Administered Medications  Medication Dose Route Frequency Provider Last Rate Last Dose  . regadenoson (LEXISCAN) 0.4 MG/5ML injection SOLN           . 0.9 %  sodium chloride infusion   Intravenous Continuous Lynden OxfordPranav Patel, MD 100 mL/hr at 06/27/14 2029    . azithromycin (ZITHROMAX) 500 mg in dextrose 5 % 250 mL IVPB  500 mg Intravenous Q24H Lynden OxfordPranav Patel, MD   500 mg at 06/27/14 2028  . cefTRIAXone (ROCEPHIN) 1 g in dextrose 5 % 50 mL IVPB  1 g Intravenous Q24H Lynden OxfordPranav Patel, MD   1 g at 06/27/14 2228  . diltiazem (CARDIZEM CD) 24 hr capsule 120 mg  120 mg Oral Daily Leone BrandLaura R Ingold, NP      . guaiFENesin [facility](MUCINEX) 12 hr tablet 600 mg  600 mg Oral BID Lynden OxfordPranav Patel, MD   600 mg at 06/27/14 2228  . regadenoson (LEXISCAN) injection SOLN 0.4 mg  0.4 mg Intravenous Once Leone BrandLaura R Ingold, NP      . technetium sestamibi generic (CARDIOLITE) injection 30 milli Curie  30 milli Curie Intravenous Once PRN Medication Radiologist, MD        PE: General appearance: alert, cooperative and no distress Lungs: decreased BS in the upper left.  Mild diffuse crackles. Heart: regular rate and rhythm, S1, S2 normal, no murmur, click, rub or gallop Extremities: No LEE Pulses: 2+ and symmetric Skin: Warm and dry Neurologic: Grossly normal  Lab Results:   Recent Labs  06/26/14 1802 06/27/14 0254 06/28/14 0325  WBC 6.9 6.2 4.9  HGB 13.2 11.5* 10.4*  HCT 38.4 34.8*  31.5*  PLT 266 224 205   BMET  Recent Labs  06/26/14 1802 06/27/14 0910 06/28/14 0325  NA 136 140 139  K 2.7* 3.5 3.9  CL 98 106 112  CO2 25 28 25   GLUCOSE 132* 97 105*  BUN 20 13 11   CREATININE 1.01 0.78 0.79  CALCIUM 9.5 8.6 8.3*   Cardiac Panel (last 3 results)  Recent Labs  06/26/14 2107 06/27/14 0254 06/27/14 0910  TROPONINI <0.03 <0.03 <0.03      Assessment/Plan  Principal Problem:   CAP (community acquired pneumonia) Active Problems:   Atrial fibrillation with RVR, CHA2DS2-VASc Score 3, female, age 69>65, HTN   Abnormal EKG   Hypertension, essential   Hypokalemia  Plan:  Maintaining NSR on Po cardizem 120.  BP controlled.  Potassium WNL.  Echo pending.  SP lexiscan cardiolite which she tolerated well.   LOS: 2 days    HAGER, BRYAN PA-C 06/28/2014 9:54 AM  Patient seen and discussed with PA Hager. Isolated episode of afib during admit in setting of pulmonary infection, continuing to monitor at this time prior to committing to anticoag. Significant transient ST/T changes as well noted, Lexiscan has been ordered by Dr Gala RomneyBensimhon. Trops negative Will f/u results of echo and Lexican. No recurrence of afib noted  on tele. Some occasional low HRs noted on dilt, asymptomatic. Continue to follow at this time, if persistent may need to change to lower dose bid short acting dilt   Dominga Ferry MD

## 2014-06-28 NOTE — Discharge Summary (Addendum)
Physician Discharge Summary  Lacey Tran WUJ:[id number] DOB: Dec 02, 1945 DOA: 06/26/2014  PCP: Estanislado Pandy, MD  Admit date: 06/26/2014 Discharge date: 06/29/2014  Time spent: 45 minutes  Recommendations for Outpatient Follow-up:  Patient will be discharged to home.  She will need to follow up with her primary care physician within 1 week.  Patient will also need to followup with cardiology.  Patient should continue her medications as prescribed.  She should follow a heart healthy diet and resume activity as tolerated.  Discharge Diagnoses:  Principal Problem:   CAP (community acquired pneumonia) Active Problems:   Atrial fibrillation with RVR, CHA2DS2-VASc Score 3, female, age >32, HTN   Abnormal EKG   Hypertension, essential   Hypokalemia   Discharge Condition: Stable  Diet recommendation: Heart healthy  Filed Weights   06/27/14 0500 06/28/14 0528 06/29/14 0338  Weight: 68.947 kg (152 lb) 68.9 kg (151 lb 14.4 oz) 68.584 kg (151 lb 3.2 oz)    History of present illness:  on 06/27/2014 by Dr. Lynden Oxford Lacey Tran is a 69 y.o. female with Past medical history of hypertension. The patient presented with complaints of shortness of breath. She mentions that since last 2-3 weeks she has been having productive cough. She was initially treated as bronchitis when she has seen by her PCP. She has received a steroid injection and Tessalon Perles for symptomatic treatment. Despite this her cough has not improved and she progressively has worsening cough. To the extent that she occasionally has some abdominal pain because of the cough. She also started having fever. Over last 2 days that she has episodes of shortness of breath to the extent she would be choking and gasping for air. Due to one of her episode today she was brought here to the ER. Patient denies any complaints of chest pain or chest tightness. She complains of dizziness and lightheadedness with poor oral intake. No prior  history of bleeding or surgery or head injury. She has a history of high blood pressure and has been taking hydrochlorothiazide and has been having difficulty maintaining her potassium level. The patient is coming from home And at her baseline independent for most of her ADL.  Hospital Course:  Community Acquired Pneumonia -CXR: The airspace opacity in the posterior lateral right lower lobe -Initially placed on Azithromycin/ceftriaxone; will discharge patient with levaquin -Blood cultures no growth to date -Urine strep pneumonia Ag negative -Sputum culture: mod WBC, few GPC, rare GNR -influenza negative  New Atrial fibrillation with RVR -Patient has converted back to sinus rhythm -CHADSVasc 3 -Initially started on heparin and discontinued -Continue cardizem PO -Cardiology consulted and appreciated and recommended holding anticoagulation for now (patient request) and if Afib occurs again-would anticoagulate -troponins cycled and negative -TSH 3.867 -Magnesium 1.9 -Potassium 2.7 upon admission, replaced currently 3.9  Abnormal EKG -Troponins negative -Patient chest pain free -Lexiscan: No reversible ischemia or infarction, EF 71% -Echocardiogram can be done as an outpatient (per cardiology)  Shortness of breath  -Secondary to multifactorial causes: Afib vs CAP -Improving -Treatment and plan as above  Hypokalemia -Resolved, likely secondary to HCTZ  Hypertension -HCTZ held -BP stable  Normocytic Anemia -Drop in Hb likely dilutional -Continue to monitor CBC  Procedures: Echocardiogram Lexiscan myoview  Consultations: Cardiology  Discharge Exam: Filed Vitals:   06/29/14 0338  BP: 141/70  Pulse: 66  Temp: 98.7 F (37.1 C)  Resp: 17   Exam  General: Well developed, well nourished, NAD, appears stated age  HEENT: NCAT, mucous membranes  moist.   Cardiovascular: S1 S2 auscultated, no rubs, murmurs or gallops. Regular rate and rhythm.  Respiratory: Clear to  auscultation   Abdomen: Soft, nontender, nondistended, + bowel sounds  Extremities: warm dry without cyanosis clubbing or edema  Neuro: AAOx3, nonfocal  Psych: Normal affect and demeanor with intact judgement and insight  Discharge Instructions      Discharge Instructions    Discharge instructions    Complete by:  As directed   Patient will be discharged to home.  She will need to follow up with her primary care physician within 1 week.  Patient will also need to followup with cardiology.  Patient should continue her medications as prescribed.  She should follow a heart healthy diet and resume activity as tolerated.            Medication List    TAKE these medications        benzonatate 100 MG capsule  Commonly known as:  TESSALON  Take 100 mg by mouth 3 (three) times daily as needed for cough.     diltiazem 120 MG 24 hr capsule  Commonly known as:  CARDIZEM CD  Take 1 capsule (120 mg total) by mouth daily.     hydrochlorothiazide 25 MG tablet  Commonly known as:  HYDRODIURIL  Take 25 mg by mouth daily.     levofloxacin 750 MG tablet  Commonly known as:  LEVAQUIN  Take 1 tablet (750 mg total) by mouth daily.     potassium chloride 10 MEQ tablet  Commonly known as:  K-DUR,KLOR-CON  Take 10 mEq by mouth daily.       No Known Allergies Follow-up Information    Follow up with Estanislado Pandy, MD. Schedule an appointment as soon as possible for a visit in 1 week.   Specialty:  Family Medicine   Why:  Hospital followup   Contact information:   [address] [place] [address] [phone]       Follow up with [facility] R, NP.   Specialty:  Cardiology   Contact information:   [address] [place] [address] [phone]        The results of significant diagnostics from this hospitalization (including imaging, microbiology, ancillary and laboratory) are listed below for reference.    Significant Diagnostic Studies: Dg Chest 2  View  06/26/2014   CLINICAL DATA:  69 year old female with a 2 week history of cough, congestion and bronchitis.  EXAM: CHEST  2 VIEW  COMPARISON:  None.  FINDINGS: Focal patchy airspace opacity in the posterolateral right lower lobe. Nonspecific central bronchitic change and coarsening of the interstitial markings may be chronic. Cardiac and mediastinal contours are within normal limits. No pleural effusion, pulmonary edema or pneumothorax. No acute osseous abnormality.  IMPRESSION: 1. Patchy airspace opacity in the posterolateral right lower lobe may concerning for early bronchopneumonia. 2. Central bronchitic change and interstitial coarsening are favored to reflect chronic parenchymal changes. However, without prior imaging for comparison an acute atypical infectious process is difficult to exclude entirely.   Electronically Signed   By: Malachy Moan M.D.   On: 06/26/2014 18:51   Nm Myocar Multi W/spect W/wall Motion / Ef  06/28/2014   CLINICAL DATA:  Abnormal EKG, hypertension  EXAM: MYOCARDIAL IMAGING WITH SPECT (REST AND PHARMACOLOGIC-STRESS)  GATED LEFT VENTRICULAR WALL MOTION STUDY  LEFT VENTRICULAR EJECTION FRACTION  TECHNIQUE: Standard myocardial SPECT imaging was performed after resting intravenous injection of 10 mCi Tc-3m sestamibi. Subsequently, intravenous infusion  of Lexiscan was performed under the supervision of the Cardiology staff. At peak effect of the drug, 30 mCi Tc-67m sestamibi was injected intravenously and standard myocardial SPECT imaging was performed. Quantitative gated imaging was also performed to evaluate left ventricular wall motion, and estimate left ventricular ejection fraction.  COMPARISON:  None.  FINDINGS: Perfusion: There is mild apical thinning. No decreased activity in the left ventricle on stress imaging to suggest reversible ischemia or infarction.  Wall Motion: Normal left ventricular wall motion. No left ventricular dilation.  Left Ventricular Ejection  Fraction: 71 %  End diastolic volume 20 ml  End systolic volume 67 ml  IMPRESSION: 1. No reversible ischemia or infarction.  2. Normal left ventricular wall motion.  3. Left ventricular ejection fraction 71%  4. Low-risk stress test findings*.  *2012 Appropriate Use Criteria for Coronary Revascularization Focused Update: J Am Coll Cardiol. 2012;59(9):857-881. http://content.dementiazones.com.aspx?articleid=1201161   Electronically Signed   By: Oley Balm M.D.   On: 06/28/2014 18:03    Microbiology: Recent Results (from the past 240 hour(s))  Culture, blood (routine x 2)     Status: None (Preliminary result)   Collection Time: 06/26/14  7:46 PM  Result Value Ref Range Status   Specimen Description BLOOD RIGHT ARM  Final   Special Requests BOTTLES DRAWN AEROBIC AND ANAEROBIC 5CC  Final   Culture   Final           BLOOD CULTURE RECEIVED NO GROWTH TO DATE CULTURE WILL BE HELD FOR 5 DAYS BEFORE ISSUING A FINAL NEGATIVE REPORT Performed at [facility]    Report Status PENDING  Incomplete  Culture, blood (routine x 2)     Status: None (Preliminary result)   Collection Time: 06/26/14  7:50 PM  Result Value Ref Range Status   Specimen Description BLOOD LEFT FOREARM  Final   Special Requests BOTTLES DRAWN AEROBIC AND ANAEROBIC 5CC  Final   Culture   Final           BLOOD CULTURE RECEIVED NO GROWTH TO DATE CULTURE WILL BE HELD FOR 5 DAYS BEFORE ISSUING A FINAL NEGATIVE REPORT Performed at [facility]    Report Status PENDING  Incomplete  Culture, sputum-assessment     Status: None   Collection Time: 06/27/14  3:54 AM  Result Value Ref Range Status   Specimen Description SPUTUM  Final   Special Requests NONE  Final   Sputum evaluation   Final    THIS SPECIMEN IS ACCEPTABLE. RESPIRATORY CULTURE REPORT TO FOLLOW.   Report Status 06/27/2014 FINAL  Final  Culture, respiratory (NON-Expectorated)     Status: None (Preliminary result)   Collection Time: 06/27/14  3:54 AM   Result Value Ref Range Status   Specimen Description SPUTUM  Final   Special Requests NONE  Final   Gram Stain   Final    MODERATE WBC PRESENT,BOTH PMN AND MONONUCLEAR RARE SQUAMOUS EPITHELIAL CELLS PRESENT FEW GRAM POSITIVE COCCI IN PAIRS IN CLUSTERS RARE GRAM NEGATIVE RODS Performed at [facility]    Culture   Final    Culture reincubated for better growth Performed at [facility]    Report Status PENDING  Incomplete     Labs: Basic Metabolic Panel:  Recent Labs Lab 06/26/14 1802 06/26/14 2107 06/27/14 0910 06/28/14 0325  NA 136  --  140 139  K 2.7*  --  3.5 3.9  CL 98  --  106 112  CO2 25  --  28 25  GLUCOSE 132*  --  97 105*  BUN 20  --  13 11  CREATININE 1.01  --  0.78 0.79  CALCIUM 9.5  --  8.6 8.3*  MG  --  1.9 1.9  --    Liver Function Tests:  Recent Labs Lab 06/27/14 0910  AST 27  ALT 17  ALKPHOS 55  BILITOT 0.7  PROT 7.1  ALBUMIN 3.1*   No results for input(s): LIPASE, AMYLASE in the last 168 hours. No results for input(s): AMMONIA in the last 168 hours. CBC:  Recent Labs Lab 06/26/14 1802 06/27/14 0254 06/28/14 0325 06/29/14 0306  WBC 6.9 6.2 4.9 5.5  NEUTROABS 4.8  --   --   --   HGB 13.2 11.5* 10.4* 10.4*  HCT 38.4 34.8* 31.5* 31.7*  MCV 89.3 91.1 91.6 91.1  PLT 266 224 205 222   Cardiac Enzymes:  Recent Labs Lab 06/26/14 1802 06/26/14 2107 06/27/14 0254 06/27/14 0910  TROPONINI <0.03 <0.03 <0.03 <0.03   BNP: BNP (last 3 results) No results for input(s): PROBNP in the last 8760 hours. CBG: No results for input(s): GLUCAP in the last 168 hours.     SignedEdsel Petrin:  Azeneth Carbonell  [facility] 06/29/2014, 9:59 AM

## 2014-06-29 LAB — STREP PNEUMONIAE URINARY ANTIGEN: STREP PNEUMO URINARY ANTIGEN: NEGATIVE

## 2014-06-29 LAB — CULTURE, RESPIRATORY W GRAM STAIN: Culture: NORMAL

## 2014-06-29 LAB — CBC
HEMATOCRIT: 31.7 % — AB (ref 36.0–46.0)
Hemoglobin: 10.4 g/dL — ABNORMAL LOW (ref 12.0–15.0)
MCH: 29.9 pg (ref 26.0–34.0)
MCHC: 32.8 g/dL (ref 30.0–36.0)
MCV: 91.1 fL (ref 78.0–100.0)
PLATELETS: 222 10*3/uL (ref 150–400)
RBC: 3.48 MIL/uL — ABNORMAL LOW (ref 3.87–5.11)
RDW: 14.1 % (ref 11.5–15.5)
WBC: 5.5 10*3/uL (ref 4.0–10.5)

## 2014-06-29 LAB — FOLATE: FOLATE: 17.9 ng/mL

## 2014-06-29 LAB — VITAMIN B12: VITAMIN B 12: 287 pg/mL (ref 211–911)

## 2014-06-29 LAB — FERRITIN: Ferritin: 29 ng/mL (ref 10–291)

## 2014-06-29 LAB — CULTURE, RESPIRATORY

## 2014-06-29 MED ORDER — DILTIAZEM HCL ER COATED BEADS 120 MG PO CP24: 120.0000 mg | ORAL_CAPSULE | Freq: Every day | ORAL | Status: DC

## 2014-06-29 MED ORDER — LEVOFLOXACIN 750 MG PO TABS: 750.0000 mg | ORAL_TABLET | Freq: Every day | ORAL | Status: DC

## 2014-06-29 NOTE — Discharge Instructions (Signed)
Atrial Fibrillation °Atrial fibrillation is a type of irregular heart rhythm (arrhythmia). During atrial fibrillation, the upper chambers of the heart (atria) quiver continuously in a chaotic pattern. This causes an irregular and often rapid heart rate.  °Atrial fibrillation is the result of the heart becoming overloaded with disorganized signals that tell it to beat. These signals are normally released one at a time by a part of the right atrium called the sinoatrial node. They then travel from the atria to the lower chambers of the heart (ventricles), causing the atria and ventricles to contract and pump blood as they pass. In atrial fibrillation, parts of the atria outside of the sinoatrial node also release these signals. This results in two problems. First, the atria receive so many signals that they do not have time to fully contract. Second, the ventricles, which can only receive one signal at a time, beat irregularly and out of rhythm with the atria.  °There are three types of atrial fibrillation:  °· Paroxysmal. Paroxysmal atrial fibrillation starts suddenly and stops on its own within a week. °· Persistent. Persistent atrial fibrillation lasts for more than a week. It may stop on its own or with treatment. °· Permanent. Permanent atrial fibrillation does not go away. Episodes of atrial fibrillation may lead to permanent atrial fibrillation. °Atrial fibrillation can prevent your heart from pumping blood normally. It increases your risk of stroke and can lead to heart failure.  °CAUSES  °· Heart conditions, including a heart attack, heart failure, coronary artery disease, and heart valve conditions.   °· Inflammation of the sac that surrounds the heart (pericarditis). °· Blockage of an artery in the lungs (pulmonary embolism). °· Pneumonia or other infections. °· Chronic lung disease. °· Thyroid problems, especially if the thyroid is overactive (hyperthyroidism). °· Caffeine, excessive alcohol use, and use  of some illegal drugs.   °· Use of some medicines, including certain decongestants and diet pills. °· Heart surgery.   °· Birth defects.   °Sometimes, no cause can be found. When this happens, the atrial fibrillation is called lone atrial fibrillation. The risk of complications from atrial fibrillation increases if you have lone atrial fibrillation and you are age 60 years or older. °RISK FACTORS °· Heart failure. °· Coronary artery disease. °· Diabetes mellitus.   °· High blood pressure (hypertension).   °· Obesity.   °· Other arrhythmias.   °· Increased age. °SIGNS AND SYMPTOMS  °· A feeling that your heart is beating rapidly or irregularly.   °· A feeling of discomfort or pain in your chest.   °· Shortness of breath.   °· Sudden light-headedness or weakness.   °· Getting tired easily when exercising.   °· Urinating more often than normal (mainly when atrial fibrillation first begins).   °In paroxysmal atrial fibrillation, symptoms may start and suddenly stop. °DIAGNOSIS  °Your health care provider may be able to detect atrial fibrillation when taking your pulse. Your health care provider may have you take a test called an ambulatory electrocardiogram (ECG). An ECG records your heartbeat patterns over a 24-hour period. You may also have other tests, such as: °· Transthoracic echocardiogram (TTE). During echocardiography, sound waves are used to evaluate how blood flows through your heart. °· Transesophageal echocardiogram (TEE). °· Stress test. There is more than one type of stress test. If a stress test is needed, ask your health care provider about which type is best for you. °· Chest X-ray exam. °· Blood tests. °· Computed tomography (CT). °TREATMENT  °Treatment may include: °· Treating any underlying conditions. For example, if you   have an overactive thyroid, treating the condition may correct atrial fibrillation.  Taking medicine. Medicines may be given to control a rapid heart rate or to prevent blood  clots, heart failure, or a stroke.  Having a procedure to correct the rhythm of the heart:  Electrical cardioversion. During electrical cardioversion, a controlled, low-energy shock is delivered to the heart through your skin. If you have chest pain, very low blood pressure, or sudden heart failure, this procedure may need to be done as an emergency.  Catheter ablation. During this procedure, heart tissues that send the signals that cause atrial fibrillation are destroyed.  Surgical ablation. During this surgery, thin lines of heart tissue that carry the abnormal signals are destroyed. This procedure can either be an open-heart surgery or a minimally invasive surgery. With the minimally invasive surgery, small cuts are made to access the heart instead of a large opening.  Pulmonary venous isolation. During this surgery, tissue around the veins that carry blood from the lungs (pulmonary veins) is destroyed. This tissue is thought to carry the abnormal signals. HOME CARE INSTRUCTIONS   Take medicines only as directed by your health care provider. Some medicines can make atrial fibrillation worse or recur.  If blood thinners were prescribed by your health care provider, take them exactly as directed. Too much blood-thinning medicine can cause bleeding. If you take too little, you will not have the needed protection against stroke and other problems.  Perform blood tests at home if directed by your health care provider. Perform blood tests exactly as directed.  Quit smoking if you smoke.  Do not drink alcohol.  Do not drink caffeinated beverages such as coffee, soda, and some teas. You may drink decaffeinated coffee, soda, or tea.   Maintain a healthy weight.Do not use diet pills unless your health care provider approves. They may make heart problems worse.   Follow diet instructions as directed by your health care provider.  Exercise regularly as directed by your health care  provider.  Keep all follow-up visits as directed by your health care provider. This is important. PREVENTION  The following substances can cause atrial fibrillation to recur:   Caffeinated beverages.  Alcohol.  Certain medicines, especially those used for breathing problems.  Certain herbs and herbal medicines, such as those containing ephedra or ginseng.  Illegal drugs, such as cocaine and amphetamines. Sometimes medicines are given to prevent atrial fibrillation from recurring. Proper treatment of any underlying condition is also important in helping prevent recurrence.  SEEK MEDICAL CARE IF:  You notice a change in the rate, rhythm, or strength of your heartbeat.  You suddenly begin urinating more frequently.  You tire more easily when exerting yourself or exercising. SEEK IMMEDIATE MEDICAL CARE IF:   You have chest pain, abdominal pain, sweating, or weakness.  You feel nauseous.  You have shortness of breath.  You suddenly have swollen feet and ankles.  You feel dizzy.  Your face or limbs feel numb or weak.  You have a change in your vision or speech. MAKE SURE YOU:   Understand these instructions.  Will watch your condition.  Will get help right away if you are not doing well or get worse. Document Released: 05/16/2005 Document Revised: 09/30/2013 Document Reviewed: 06/26/2012 [facility] Patient Information 2015 [place]. This information is not intended to replace advice given to you by your health care provider. Make sure you discuss any questions you have with your health care provider. Pneumonia Pneumonia is an infection of  the lungs.  CAUSES Pneumonia may be caused by bacteria or a virus. Usually, these infections are caused by breathing infectious particles into the lungs (respiratory tract). SIGNS AND SYMPTOMS   Cough.  Fever.  Chest pain.  Increased rate of breathing.  Wheezing.  Mucus production. DIAGNOSIS  If you have the common  symptoms of pneumonia, your health care provider will typically confirm the diagnosis with a chest X-ray. The X-ray will show an abnormality in the lung (pulmonary infiltrate) if you have pneumonia. Other tests of your blood, urine, or sputum may be done to find the specific cause of your pneumonia. Your health care provider may also do tests (blood gases or pulse oximetry) to see how well your lungs are working. TREATMENT  Some forms of pneumonia may be spread to other people when you cough or sneeze. You may be asked to wear a mask before and during your exam. Pneumonia that is caused by bacteria is treated with antibiotic medicine. Pneumonia that is caused by the influenza virus may be treated with an antiviral medicine. Most other viral infections must run their course. These infections will not respond to antibiotics.  HOME CARE INSTRUCTIONS   Cough suppressants may be used if you are losing too much rest. However, coughing protects you by clearing your lungs. You should avoid using cough suppressants if you can.  Your health care provider may have prescribed medicine if he or she thinks your pneumonia is caused by bacteria or influenza. Finish your medicine even if you start to feel better.  Your health care provider may also prescribe an expectorant. This loosens the mucus to be coughed up.  Take medicines only as directed by your health care provider.  Do not smoke. Smoking is a common cause of bronchitis and can contribute to pneumonia. If you are a smoker and continue to smoke, your cough may last several weeks after your pneumonia has cleared.  A cold steam vaporizer or humidifier in your room or home may help loosen mucus.  Coughing is often worse at night. Sleeping in a semi-upright position in a recliner or using a couple pillows under your head will help with this.  Get rest as you feel it is needed. Your body will usually let you know when you need to rest. PREVENTION A  pneumococcal shot (vaccine) is available to prevent a common bacterial cause of pneumonia. This is usually suggested for:  People over 54 years old.  Patients on chemotherapy.  People with chronic lung problems, such as bronchitis or emphysema.  People with immune system problems. If you are over 65 or have a high risk condition, you may receive the pneumococcal vaccine if you have not received it before. In some countries, a routine influenza vaccine is also recommended. This vaccine can help prevent some cases of pneumonia.You may be offered the influenza vaccine as part of your care. If you smoke, it is time to quit. You may receive instructions on how to stop smoking. Your health care provider can provide medicines and counseling to help you quit. SEEK MEDICAL CARE IF: You have a fever. SEEK IMMEDIATE MEDICAL CARE IF:   Your illness becomes worse. This is especially true if you are elderly or weakened from any other disease.  You cannot control your cough with suppressants and are losing sleep.  You begin coughing up blood.  You develop pain which is getting worse or is uncontrolled with medicines.  Any of the symptoms which initially brought you in  for treatment are getting worse rather than better.  You develop shortness of breath or chest pain. MAKE SURE YOU:   Understand these instructions.  Will watch your condition.  Will get help right away if you are not doing well or get worse. Document Released: 05/16/2005 Document Revised: 09/30/2013 Document Reviewed: 08/05/2010 [facility] Patient Information 2015 [place], [place]. This information is not intended to replace advice given to you by your health care provider. Make sure you discuss any questions you have with your health care provider.

## 2014-06-29 NOTE — Progress Notes (Signed)
DC IV and tele per MD orders and protocol; DC instructions reviewed with patient and spouse at bedside; no further questions from patient or spouse.  Lacey BartersBOWMAN, Gaylan Fauver M, RN

## 2014-06-29 NOTE — Progress Notes (Signed)
Patient ID: Lacey Tran, female   DOB: [birth year], 69 y.o.   MRN: [id number]  Patient for discharge this AM. Lexiscan without evidence of ischemia. Echo ordered but results are not in computer. Normal LVEF by nuclear. [facility]. No recurrence of afib on tele review.  If echo not complete by discharge can arrange at follow up. Will signoff inpatient care.    Dominga FerryJ Stormie Ventola MD

## 2014-06-30 LAB — LEGIONELLA ANTIGEN, URINE

## 2014-06-30 LAB — HIV ANTIBODY (ROUTINE TESTING W REFLEX): HIV Screen 4th Generation wRfx: NONREACTIVE

## 2014-07-01 DIAGNOSIS — Z1389 Encounter for screening for other disorder: Secondary | ICD-10-CM | POA: Diagnosis not present

## 2014-07-03 LAB — CULTURE, BLOOD (ROUTINE X 2)
Culture: NO GROWTH
Culture: NO GROWTH

## 2014-07-07 DIAGNOSIS — I480 Paroxysmal atrial fibrillation: Secondary | ICD-10-CM | POA: Diagnosis not present

## 2014-07-07 DIAGNOSIS — I4891 Unspecified atrial fibrillation: Secondary | ICD-10-CM | POA: Diagnosis not present

## 2014-07-08 ENCOUNTER — Telehealth: Payer: Self-pay | Admitting: Cardiology

## 2014-07-08 NOTE — Telephone Encounter (Signed)
Please notify pt that Dr. Wyline MoodBranch ordered monitor for PAF, to see if any more,  She was to have follow up arranged as well.  If she is seeing cardiology somewhere else that is fine.

## 2014-07-29 DIAGNOSIS — I10 Essential (primary) hypertension: Secondary | ICD-10-CM | POA: Diagnosis not present

## 2014-07-29 DIAGNOSIS — J189 Pneumonia, unspecified organism: Secondary | ICD-10-CM | POA: Diagnosis not present

## 2014-07-29 DIAGNOSIS — E876 Hypokalemia: Secondary | ICD-10-CM | POA: Diagnosis not present

## 2014-07-29 DIAGNOSIS — I480 Paroxysmal atrial fibrillation: Secondary | ICD-10-CM | POA: Diagnosis not present

## 2014-07-29 DIAGNOSIS — E780 Pure hypercholesterolemia: Secondary | ICD-10-CM | POA: Diagnosis not present

## 2014-07-31 DIAGNOSIS — E780 Pure hypercholesterolemia: Secondary | ICD-10-CM | POA: Diagnosis not present

## 2014-07-31 DIAGNOSIS — I480 Paroxysmal atrial fibrillation: Secondary | ICD-10-CM | POA: Diagnosis not present

## 2014-07-31 DIAGNOSIS — I10 Essential (primary) hypertension: Secondary | ICD-10-CM | POA: Diagnosis not present

## 2014-07-31 DIAGNOSIS — E876 Hypokalemia: Secondary | ICD-10-CM | POA: Diagnosis not present

## 2014-07-31 DIAGNOSIS — J189 Pneumonia, unspecified organism: Secondary | ICD-10-CM | POA: Diagnosis not present

## 2014-11-07 DIAGNOSIS — I10 Essential (primary) hypertension: Secondary | ICD-10-CM | POA: Diagnosis not present

## 2014-11-07 DIAGNOSIS — E876 Hypokalemia: Secondary | ICD-10-CM | POA: Diagnosis not present

## 2014-11-07 DIAGNOSIS — E780 Pure hypercholesterolemia: Secondary | ICD-10-CM | POA: Diagnosis not present

## 2014-11-12 DIAGNOSIS — I10 Essential (primary) hypertension: Secondary | ICD-10-CM | POA: Diagnosis not present

## 2014-11-12 DIAGNOSIS — I480 Paroxysmal atrial fibrillation: Secondary | ICD-10-CM | POA: Diagnosis not present

## 2014-11-12 DIAGNOSIS — E876 Hypokalemia: Secondary | ICD-10-CM | POA: Diagnosis not present

## 2014-11-12 DIAGNOSIS — E780 Pure hypercholesterolemia: Secondary | ICD-10-CM | POA: Diagnosis not present

## 2015-01-13 DIAGNOSIS — I4891 Unspecified atrial fibrillation: Secondary | ICD-10-CM | POA: Diagnosis not present

## 2015-01-22 DIAGNOSIS — H40033 Anatomical narrow angle, bilateral: Secondary | ICD-10-CM | POA: Diagnosis not present

## 2015-01-22 DIAGNOSIS — H3531 Nonexudative age-related macular degeneration: Secondary | ICD-10-CM | POA: Diagnosis not present

## 2015-02-20 DIAGNOSIS — E780 Pure hypercholesterolemia: Secondary | ICD-10-CM | POA: Diagnosis not present

## 2015-02-20 DIAGNOSIS — I10 Essential (primary) hypertension: Secondary | ICD-10-CM | POA: Diagnosis not present

## 2015-02-20 DIAGNOSIS — E782 Mixed hyperlipidemia: Secondary | ICD-10-CM | POA: Diagnosis not present

## 2015-02-20 DIAGNOSIS — E876 Hypokalemia: Secondary | ICD-10-CM | POA: Diagnosis not present

## 2015-02-27 DIAGNOSIS — Z23 Encounter for immunization: Secondary | ICD-10-CM | POA: Diagnosis not present

## 2015-02-27 DIAGNOSIS — I480 Paroxysmal atrial fibrillation: Secondary | ICD-10-CM | POA: Diagnosis not present

## 2015-02-27 DIAGNOSIS — E780 Pure hypercholesterolemia: Secondary | ICD-10-CM | POA: Diagnosis not present

## 2015-02-27 DIAGNOSIS — E876 Hypokalemia: Secondary | ICD-10-CM | POA: Diagnosis not present

## 2015-02-27 DIAGNOSIS — I10 Essential (primary) hypertension: Secondary | ICD-10-CM | POA: Diagnosis not present

## 2015-04-27 DIAGNOSIS — N3000 Acute cystitis without hematuria: Secondary | ICD-10-CM | POA: Diagnosis not present

## 2015-07-15 DIAGNOSIS — R300 Dysuria: Secondary | ICD-10-CM | POA: Diagnosis not present

## 2015-08-13 DIAGNOSIS — I10 Essential (primary) hypertension: Secondary | ICD-10-CM | POA: Diagnosis not present

## 2015-08-13 DIAGNOSIS — E7800 Pure hypercholesterolemia, unspecified: Secondary | ICD-10-CM | POA: Diagnosis not present

## 2015-08-13 DIAGNOSIS — E876 Hypokalemia: Secondary | ICD-10-CM | POA: Diagnosis not present

## 2015-08-13 DIAGNOSIS — I480 Paroxysmal atrial fibrillation: Secondary | ICD-10-CM | POA: Diagnosis not present

## 2015-08-20 DIAGNOSIS — I480 Paroxysmal atrial fibrillation: Secondary | ICD-10-CM | POA: Diagnosis not present

## 2015-08-20 DIAGNOSIS — E876 Hypokalemia: Secondary | ICD-10-CM | POA: Diagnosis not present

## 2015-08-20 DIAGNOSIS — Z1389 Encounter for screening for other disorder: Secondary | ICD-10-CM | POA: Diagnosis not present

## 2015-08-20 DIAGNOSIS — Z13220 Encounter for screening for lipoid disorders: Secondary | ICD-10-CM | POA: Diagnosis not present

## 2015-08-20 DIAGNOSIS — I10 Essential (primary) hypertension: Secondary | ICD-10-CM | POA: Diagnosis not present

## 2015-11-30 DIAGNOSIS — N3001 Acute cystitis with hematuria: Secondary | ICD-10-CM | POA: Diagnosis not present

## 2016-01-08 DIAGNOSIS — Z6824 Body mass index (BMI) 24.0-24.9, adult: Secondary | ICD-10-CM | POA: Diagnosis not present

## 2016-01-08 DIAGNOSIS — R350 Frequency of micturition: Secondary | ICD-10-CM | POA: Diagnosis not present

## 2016-02-16 DIAGNOSIS — E7800 Pure hypercholesterolemia, unspecified: Secondary | ICD-10-CM | POA: Diagnosis not present

## 2016-02-16 DIAGNOSIS — I10 Essential (primary) hypertension: Secondary | ICD-10-CM | POA: Diagnosis not present

## 2016-02-16 DIAGNOSIS — E876 Hypokalemia: Secondary | ICD-10-CM | POA: Diagnosis not present

## 2016-02-18 DIAGNOSIS — Z6824 Body mass index (BMI) 24.0-24.9, adult: Secondary | ICD-10-CM | POA: Diagnosis not present

## 2016-02-18 DIAGNOSIS — E876 Hypokalemia: Secondary | ICD-10-CM | POA: Diagnosis not present

## 2016-02-18 DIAGNOSIS — I10 Essential (primary) hypertension: Secondary | ICD-10-CM | POA: Diagnosis not present

## 2016-02-18 DIAGNOSIS — Z23 Encounter for immunization: Secondary | ICD-10-CM | POA: Diagnosis not present

## 2016-02-18 DIAGNOSIS — I480 Paroxysmal atrial fibrillation: Secondary | ICD-10-CM | POA: Diagnosis not present

## 2016-02-18 DIAGNOSIS — Z13220 Encounter for screening for lipoid disorders: Secondary | ICD-10-CM | POA: Diagnosis not present

## 2016-03-16 DIAGNOSIS — Z6824 Body mass index (BMI) 24.0-24.9, adult: Secondary | ICD-10-CM | POA: Diagnosis not present

## 2016-03-16 DIAGNOSIS — M109 Gout, unspecified: Secondary | ICD-10-CM | POA: Diagnosis not present

## 2016-04-05 DIAGNOSIS — Z6824 Body mass index (BMI) 24.0-24.9, adult: Secondary | ICD-10-CM | POA: Diagnosis not present

## 2016-04-05 DIAGNOSIS — N3001 Acute cystitis with hematuria: Secondary | ICD-10-CM | POA: Diagnosis not present

## 2016-08-16 DIAGNOSIS — I480 Paroxysmal atrial fibrillation: Secondary | ICD-10-CM | POA: Diagnosis not present

## 2016-08-16 DIAGNOSIS — E876 Hypokalemia: Secondary | ICD-10-CM | POA: Diagnosis not present

## 2016-08-16 DIAGNOSIS — I10 Essential (primary) hypertension: Secondary | ICD-10-CM | POA: Diagnosis not present

## 2016-08-16 DIAGNOSIS — E7800 Pure hypercholesterolemia, unspecified: Secondary | ICD-10-CM | POA: Diagnosis not present

## 2016-08-18 DIAGNOSIS — E7800 Pure hypercholesterolemia, unspecified: Secondary | ICD-10-CM | POA: Diagnosis not present

## 2016-08-18 DIAGNOSIS — I10 Essential (primary) hypertension: Secondary | ICD-10-CM | POA: Diagnosis not present

## 2016-08-18 DIAGNOSIS — I480 Paroxysmal atrial fibrillation: Secondary | ICD-10-CM | POA: Diagnosis not present

## 2016-08-18 DIAGNOSIS — R7989 Other specified abnormal findings of blood chemistry: Secondary | ICD-10-CM | POA: Diagnosis not present

## 2016-08-18 DIAGNOSIS — E876 Hypokalemia: Secondary | ICD-10-CM | POA: Diagnosis not present

## 2016-12-13 DIAGNOSIS — I480 Paroxysmal atrial fibrillation: Secondary | ICD-10-CM | POA: Diagnosis not present

## 2016-12-13 DIAGNOSIS — I10 Essential (primary) hypertension: Secondary | ICD-10-CM | POA: Diagnosis not present

## 2016-12-13 DIAGNOSIS — E7800 Pure hypercholesterolemia, unspecified: Secondary | ICD-10-CM | POA: Diagnosis not present

## 2016-12-13 DIAGNOSIS — R7989 Other specified abnormal findings of blood chemistry: Secondary | ICD-10-CM | POA: Diagnosis not present

## 2016-12-13 DIAGNOSIS — E876 Hypokalemia: Secondary | ICD-10-CM | POA: Diagnosis not present

## 2016-12-16 DIAGNOSIS — E7800 Pure hypercholesterolemia, unspecified: Secondary | ICD-10-CM | POA: Diagnosis not present

## 2016-12-16 DIAGNOSIS — E876 Hypokalemia: Secondary | ICD-10-CM | POA: Diagnosis not present

## 2016-12-16 DIAGNOSIS — I480 Paroxysmal atrial fibrillation: Secondary | ICD-10-CM | POA: Diagnosis not present

## 2016-12-16 DIAGNOSIS — R7989 Other specified abnormal findings of blood chemistry: Secondary | ICD-10-CM | POA: Diagnosis not present

## 2016-12-16 DIAGNOSIS — Z1389 Encounter for screening for other disorder: Secondary | ICD-10-CM | POA: Diagnosis not present

## 2016-12-16 DIAGNOSIS — I10 Essential (primary) hypertension: Secondary | ICD-10-CM | POA: Diagnosis not present

## 2017-03-13 DIAGNOSIS — Z23 Encounter for immunization: Secondary | ICD-10-CM | POA: Diagnosis not present

## 2017-06-13 DIAGNOSIS — E7800 Pure hypercholesterolemia, unspecified: Secondary | ICD-10-CM | POA: Diagnosis not present

## 2017-06-13 DIAGNOSIS — I10 Essential (primary) hypertension: Secondary | ICD-10-CM | POA: Diagnosis not present

## 2017-06-13 DIAGNOSIS — R7989 Other specified abnormal findings of blood chemistry: Secondary | ICD-10-CM | POA: Diagnosis not present

## 2017-06-13 DIAGNOSIS — E876 Hypokalemia: Secondary | ICD-10-CM | POA: Diagnosis not present

## 2017-06-15 DIAGNOSIS — I480 Paroxysmal atrial fibrillation: Secondary | ICD-10-CM | POA: Diagnosis not present

## 2017-06-15 DIAGNOSIS — E7800 Pure hypercholesterolemia, unspecified: Secondary | ICD-10-CM | POA: Diagnosis not present

## 2017-06-15 DIAGNOSIS — Z0000 Encounter for general adult medical examination without abnormal findings: Secondary | ICD-10-CM | POA: Diagnosis not present

## 2017-06-15 DIAGNOSIS — I10 Essential (primary) hypertension: Secondary | ICD-10-CM | POA: Diagnosis not present

## 2017-06-15 DIAGNOSIS — Z23 Encounter for immunization: Secondary | ICD-10-CM | POA: Diagnosis not present

## 2017-06-15 DIAGNOSIS — E876 Hypokalemia: Secondary | ICD-10-CM | POA: Diagnosis not present

## 2017-09-30 DIAGNOSIS — R42 Dizziness and giddiness: Secondary | ICD-10-CM | POA: Diagnosis not present

## 2017-09-30 DIAGNOSIS — I10 Essential (primary) hypertension: Secondary | ICD-10-CM | POA: Diagnosis not present

## 2017-11-15 DIAGNOSIS — N3001 Acute cystitis with hematuria: Secondary | ICD-10-CM | POA: Diagnosis not present

## 2017-11-15 DIAGNOSIS — Z6824 Body mass index (BMI) 24.0-24.9, adult: Secondary | ICD-10-CM | POA: Diagnosis not present

## 2017-11-23 DIAGNOSIS — N3001 Acute cystitis with hematuria: Secondary | ICD-10-CM | POA: Diagnosis not present

## 2017-11-23 DIAGNOSIS — R300 Dysuria: Secondary | ICD-10-CM | POA: Diagnosis not present

## 2017-11-23 DIAGNOSIS — Z6824 Body mass index (BMI) 24.0-24.9, adult: Secondary | ICD-10-CM | POA: Diagnosis not present

## 2017-12-11 DIAGNOSIS — E876 Hypokalemia: Secondary | ICD-10-CM | POA: Diagnosis not present

## 2017-12-11 DIAGNOSIS — I10 Essential (primary) hypertension: Secondary | ICD-10-CM | POA: Diagnosis not present

## 2017-12-11 DIAGNOSIS — I480 Paroxysmal atrial fibrillation: Secondary | ICD-10-CM | POA: Diagnosis not present

## 2017-12-11 DIAGNOSIS — R42 Dizziness and giddiness: Secondary | ICD-10-CM | POA: Diagnosis not present

## 2017-12-11 DIAGNOSIS — E7800 Pure hypercholesterolemia, unspecified: Secondary | ICD-10-CM | POA: Diagnosis not present

## 2017-12-14 DIAGNOSIS — E876 Hypokalemia: Secondary | ICD-10-CM | POA: Diagnosis not present

## 2017-12-14 DIAGNOSIS — I480 Paroxysmal atrial fibrillation: Secondary | ICD-10-CM | POA: Diagnosis not present

## 2017-12-14 DIAGNOSIS — I10 Essential (primary) hypertension: Secondary | ICD-10-CM | POA: Diagnosis not present

## 2017-12-14 DIAGNOSIS — R7989 Other specified abnormal findings of blood chemistry: Secondary | ICD-10-CM | POA: Diagnosis not present

## 2017-12-14 DIAGNOSIS — E7800 Pure hypercholesterolemia, unspecified: Secondary | ICD-10-CM | POA: Diagnosis not present

## 2017-12-14 DIAGNOSIS — N3001 Acute cystitis with hematuria: Secondary | ICD-10-CM | POA: Diagnosis not present

## 2018-01-03 DIAGNOSIS — N816 Rectocele: Secondary | ICD-10-CM | POA: Diagnosis not present

## 2018-01-03 DIAGNOSIS — N8189 Other female genital prolapse: Secondary | ICD-10-CM | POA: Diagnosis not present

## 2018-01-31 DIAGNOSIS — N8189 Other female genital prolapse: Secondary | ICD-10-CM | POA: Diagnosis not present

## 2018-01-31 DIAGNOSIS — M6208 Separation of muscle (nontraumatic), other site: Secondary | ICD-10-CM | POA: Diagnosis not present

## 2018-01-31 DIAGNOSIS — Z79899 Other long term (current) drug therapy: Secondary | ICD-10-CM | POA: Diagnosis not present

## 2018-01-31 DIAGNOSIS — I10 Essential (primary) hypertension: Secondary | ICD-10-CM | POA: Diagnosis not present

## 2018-01-31 DIAGNOSIS — H269 Unspecified cataract: Secondary | ICD-10-CM | POA: Diagnosis not present

## 2018-01-31 DIAGNOSIS — E785 Hyperlipidemia, unspecified: Secondary | ICD-10-CM | POA: Diagnosis not present

## 2018-01-31 DIAGNOSIS — E7800 Pure hypercholesterolemia, unspecified: Secondary | ICD-10-CM | POA: Diagnosis not present

## 2018-01-31 DIAGNOSIS — Z87440 Personal history of urinary (tract) infections: Secondary | ICD-10-CM | POA: Diagnosis not present

## 2018-02-01 DIAGNOSIS — E7800 Pure hypercholesterolemia, unspecified: Secondary | ICD-10-CM | POA: Diagnosis not present

## 2018-02-01 DIAGNOSIS — Z87440 Personal history of urinary (tract) infections: Secondary | ICD-10-CM | POA: Diagnosis not present

## 2018-02-01 DIAGNOSIS — N816 Rectocele: Secondary | ICD-10-CM | POA: Diagnosis not present

## 2018-02-01 DIAGNOSIS — I10 Essential (primary) hypertension: Secondary | ICD-10-CM | POA: Diagnosis not present

## 2018-02-01 DIAGNOSIS — M6208 Separation of muscle (nontraumatic), other site: Secondary | ICD-10-CM | POA: Diagnosis not present

## 2018-02-01 DIAGNOSIS — N8189 Other female genital prolapse: Secondary | ICD-10-CM | POA: Diagnosis not present

## 2018-02-01 DIAGNOSIS — Z79899 Other long term (current) drug therapy: Secondary | ICD-10-CM | POA: Diagnosis not present

## 2018-02-01 DIAGNOSIS — E785 Hyperlipidemia, unspecified: Secondary | ICD-10-CM | POA: Diagnosis not present

## 2018-02-01 DIAGNOSIS — H269 Unspecified cataract: Secondary | ICD-10-CM | POA: Diagnosis not present

## 2018-02-02 DIAGNOSIS — N8189 Other female genital prolapse: Secondary | ICD-10-CM | POA: Diagnosis not present

## 2018-02-02 DIAGNOSIS — M6208 Separation of muscle (nontraumatic), other site: Secondary | ICD-10-CM | POA: Diagnosis not present

## 2018-02-02 DIAGNOSIS — I10 Essential (primary) hypertension: Secondary | ICD-10-CM | POA: Diagnosis not present

## 2018-02-02 DIAGNOSIS — E785 Hyperlipidemia, unspecified: Secondary | ICD-10-CM | POA: Diagnosis not present

## 2018-02-02 DIAGNOSIS — E7800 Pure hypercholesterolemia, unspecified: Secondary | ICD-10-CM | POA: Diagnosis not present

## 2018-02-02 DIAGNOSIS — Z79899 Other long term (current) drug therapy: Secondary | ICD-10-CM | POA: Diagnosis not present

## 2018-02-02 DIAGNOSIS — H269 Unspecified cataract: Secondary | ICD-10-CM | POA: Diagnosis not present

## 2018-02-02 DIAGNOSIS — Z87440 Personal history of urinary (tract) infections: Secondary | ICD-10-CM | POA: Diagnosis not present

## 2018-02-03 DIAGNOSIS — H269 Unspecified cataract: Secondary | ICD-10-CM | POA: Diagnosis not present

## 2018-02-03 DIAGNOSIS — N8189 Other female genital prolapse: Secondary | ICD-10-CM | POA: Diagnosis not present

## 2018-02-03 DIAGNOSIS — M6208 Separation of muscle (nontraumatic), other site: Secondary | ICD-10-CM | POA: Diagnosis not present

## 2018-02-03 DIAGNOSIS — E7800 Pure hypercholesterolemia, unspecified: Secondary | ICD-10-CM | POA: Diagnosis not present

## 2018-02-03 DIAGNOSIS — Z79899 Other long term (current) drug therapy: Secondary | ICD-10-CM | POA: Diagnosis not present

## 2018-02-03 DIAGNOSIS — I10 Essential (primary) hypertension: Secondary | ICD-10-CM | POA: Diagnosis not present

## 2018-02-03 DIAGNOSIS — Z87440 Personal history of urinary (tract) infections: Secondary | ICD-10-CM | POA: Diagnosis not present

## 2018-02-03 DIAGNOSIS — E785 Hyperlipidemia, unspecified: Secondary | ICD-10-CM | POA: Diagnosis not present

## 2018-02-06 DIAGNOSIS — R300 Dysuria: Secondary | ICD-10-CM | POA: Diagnosis not present

## 2018-03-05 DIAGNOSIS — M109 Gout, unspecified: Secondary | ICD-10-CM | POA: Diagnosis not present

## 2018-03-05 DIAGNOSIS — I10 Essential (primary) hypertension: Secondary | ICD-10-CM | POA: Diagnosis not present

## 2018-03-05 DIAGNOSIS — Z6823 Body mass index (BMI) 23.0-23.9, adult: Secondary | ICD-10-CM | POA: Diagnosis not present

## 2018-03-19 DIAGNOSIS — R609 Edema, unspecified: Secondary | ICD-10-CM | POA: Diagnosis not present

## 2018-03-19 DIAGNOSIS — I10 Essential (primary) hypertension: Secondary | ICD-10-CM | POA: Diagnosis not present

## 2018-03-19 DIAGNOSIS — Z6824 Body mass index (BMI) 24.0-24.9, adult: Secondary | ICD-10-CM | POA: Diagnosis not present

## 2018-04-02 DIAGNOSIS — Z23 Encounter for immunization: Secondary | ICD-10-CM | POA: Diagnosis not present

## 2018-06-08 DIAGNOSIS — I10 Essential (primary) hypertension: Secondary | ICD-10-CM | POA: Diagnosis not present

## 2018-06-08 DIAGNOSIS — E876 Hypokalemia: Secondary | ICD-10-CM | POA: Diagnosis not present

## 2018-06-08 DIAGNOSIS — E7800 Pure hypercholesterolemia, unspecified: Secondary | ICD-10-CM | POA: Diagnosis not present

## 2018-06-13 DIAGNOSIS — E7800 Pure hypercholesterolemia, unspecified: Secondary | ICD-10-CM | POA: Diagnosis not present

## 2018-06-13 DIAGNOSIS — M109 Gout, unspecified: Secondary | ICD-10-CM | POA: Diagnosis not present

## 2018-06-13 DIAGNOSIS — Z6823 Body mass index (BMI) 23.0-23.9, adult: Secondary | ICD-10-CM | POA: Diagnosis not present

## 2018-06-13 DIAGNOSIS — I480 Paroxysmal atrial fibrillation: Secondary | ICD-10-CM | POA: Diagnosis not present

## 2018-06-13 DIAGNOSIS — Z1212 Encounter for screening for malignant neoplasm of rectum: Secondary | ICD-10-CM | POA: Diagnosis not present

## 2018-06-13 DIAGNOSIS — I10 Essential (primary) hypertension: Secondary | ICD-10-CM | POA: Diagnosis not present

## 2018-06-13 DIAGNOSIS — Z1389 Encounter for screening for other disorder: Secondary | ICD-10-CM | POA: Diagnosis not present

## 2018-06-15 LAB — IFOBT (OCCULT BLOOD): IMMUNOLOGICAL FECAL OCCULT BLOOD TEST: POSITIVE

## 2018-06-20 ENCOUNTER — Other Ambulatory Visit (INDEPENDENT_AMBULATORY_CARE_PROVIDER_SITE_OTHER): Payer: Self-pay | Admitting: Internal Medicine

## 2018-06-20 ENCOUNTER — Ambulatory Visit (INDEPENDENT_AMBULATORY_CARE_PROVIDER_SITE_OTHER): Payer: Medicare Other | Admitting: Internal Medicine

## 2018-06-20 ENCOUNTER — Encounter (INDEPENDENT_AMBULATORY_CARE_PROVIDER_SITE_OTHER): Payer: Self-pay | Admitting: *Deleted

## 2018-06-20 ENCOUNTER — Telehealth (INDEPENDENT_AMBULATORY_CARE_PROVIDER_SITE_OTHER): Payer: Self-pay | Admitting: *Deleted

## 2018-06-20 ENCOUNTER — Encounter (INDEPENDENT_AMBULATORY_CARE_PROVIDER_SITE_OTHER): Payer: Self-pay | Admitting: Internal Medicine

## 2018-06-20 VITALS — BP 137/80 | HR 60 | Temp 98.0°F | Ht 67.0 in | Wt 132.9 lb

## 2018-06-20 DIAGNOSIS — R195 Other fecal abnormalities: Secondary | ICD-10-CM | POA: Diagnosis not present

## 2018-06-20 MED ORDER — PEG 3350-KCL-NA BICARB-NACL 420 G PO SOLR: 4000.0000 mL | Freq: Once | ORAL | 0 refills | Status: AC

## 2018-06-20 NOTE — Patient Instructions (Signed)
The risks of bleeding, perforation and infection were reviewed with patient.  

## 2018-06-20 NOTE — Telephone Encounter (Signed)
Patient needs trilyte 

## 2018-06-20 NOTE — Progress Notes (Signed)
   Subjective:    Patient ID: Lacey Tran, female    DOB: Aug 19, 1945, 73 y.o.   MRN: [id number]  HPI Referred by Dr Neita Carp for positive hemosure.She tells me she denies seeing any blood. Stools are normal. Stools are brown in color.  Has weight loss of about 5 pounds. No family hx of colon cancer. Her appetite is good.  No NSAIDs except for the Baby ASA 81mg .  Her last colonoscopy was greater than 12 yrs ago at [facility].  Hx of atrial fib and maintained on a Baby ASA 81mg .   Review of Systems Past Medical History:  Diagnosis Date  . Hypertension     History reviewed. No pertinent surgical history.  No Known Allergies  Current Outpatient Medications on File Prior to Visit  Medication Sig Dispense Refill  . aspirin EC 81 MG tablet Take 81 mg by mouth daily.    . hydrochlorothiazide (HYDRODIURIL) 25 MG tablet Take 25 mg by mouth daily.    . pravastatin (PRAVACHOL) 10 MG tablet Take 10 mg by mouth daily.     No current facility-administered medications on file prior to visit.         Objective:   Physical Exam Blood pressure 137/80, pulse 60, temperature 98 F (36.7 C), height 5\' 7"  (1.702 m), weight 132 lb 14.4 oz (60.3 kg). Alert and oriented. Skin warm and dry. Oral mucosa is moist.   . Sclera anicteric, conjunctivae is pink. Thyroid not enlarged. No cervical lymphadenopathy. Lungs clear. Heart regular rate and rhythm.  Abdomen is soft. Bowel sounds are positive. No hepatomegaly. No abdominal masses felt. No tenderness.  No edema to lower extremities.          Assessment & Plan:  Positive Hemosure. Colonic neoplasm, polyp, AVM, ulcer needs to be ruled out. The risks of bleeding, perforation and infection were reviewed with patient.

## 2018-07-16 ENCOUNTER — Encounter (HOSPITAL_COMMUNITY): Payer: Self-pay | Admitting: *Deleted

## 2018-07-16 ENCOUNTER — Encounter (HOSPITAL_COMMUNITY)
Admission: RE | Disposition: A | Discharge status: Home / Self Care | Payer: Self-pay | Source: Ambulatory Visit | Attending: Internal Medicine

## 2018-07-16 ENCOUNTER — Ambulatory Visit (HOSPITAL_COMMUNITY)
Admission: RE | Admit: 2018-07-16 | Discharge: 2018-07-16 | Disposition: A | Discharge status: Home / Self Care | Payer: Medicare Other | Source: Ambulatory Visit | Attending: Internal Medicine | Admitting: Internal Medicine

## 2018-07-16 ENCOUNTER — Other Ambulatory Visit: Payer: Self-pay

## 2018-07-16 DIAGNOSIS — Z79899 Other long term (current) drug therapy: Secondary | ICD-10-CM | POA: Diagnosis not present

## 2018-07-16 DIAGNOSIS — K5730 Diverticulosis of large intestine without perforation or abscess without bleeding: Secondary | ICD-10-CM | POA: Insufficient documentation

## 2018-07-16 DIAGNOSIS — I10 Essential (primary) hypertension: Secondary | ICD-10-CM | POA: Insufficient documentation

## 2018-07-16 DIAGNOSIS — R195 Other fecal abnormalities: Secondary | ICD-10-CM | POA: Diagnosis not present

## 2018-07-16 DIAGNOSIS — K644 Residual hemorrhoidal skin tags: Secondary | ICD-10-CM | POA: Insufficient documentation

## 2018-07-16 DIAGNOSIS — Z7982 Long term (current) use of aspirin: Secondary | ICD-10-CM | POA: Diagnosis not present

## 2018-07-16 HISTORY — PX: COLONOSCOPY: SHX5424

## 2018-07-16 SURGERY — COLONOSCOPY
Anesthesia: Moderate Sedation

## 2018-07-16 MED ORDER — SODIUM CHLORIDE 0.9 % IV SOLN
INTRAVENOUS | Status: DC
  Administered 2018-07-16: 1000 mL via INTRAVENOUS

## 2018-07-16 MED ORDER — MIDAZOLAM HCL 5 MG/5ML IJ SOLN
INTRAMUSCULAR | Status: DC | PRN
  Administered 2018-07-16 (×2): 2 mg via INTRAVENOUS
  Administered 2018-07-16 (×2): 1 mg via INTRAVENOUS

## 2018-07-16 MED ORDER — STERILE WATER FOR IRRIGATION IR SOLN
Status: DC | PRN
  Administered 2018-07-16: 12:00:00

## 2018-07-16 MED ORDER — MIDAZOLAM HCL 5 MG/5ML IJ SOLN
INTRAMUSCULAR | Status: AC
  Filled 2018-07-16: qty 10

## 2018-07-16 MED ORDER — MEPERIDINE HCL 50 MG/ML IJ SOLN
INTRAMUSCULAR | Status: AC
  Filled 2018-07-16: qty 1

## 2018-07-16 MED ORDER — MEPERIDINE HCL 50 MG/ML IJ SOLN
INTRAMUSCULAR | Status: DC | PRN
  Administered 2018-07-16 (×2): 25 mg

## 2018-07-16 NOTE — H&P (Signed)
Lacey Tran is an 73 y.o. female.   Chief Complaint: Patient is here for colonoscopy. HPI: Patient is 73 year old Caucasian female who had stool testing as a screening tool for colorectal carcinoma.  She had Hemosure test and it was positive.  She denies rectal bleeding melena change in bowel habits or abdominal pain. She has never undergone colonoscopy. Last aspirin dose was 3 days ago. Family history is negative for CRC.  Past Medical History:  Diagnosis Date  . Hypertension     Past Surgical History:  Procedure Laterality Date  . complete hysterectomy     01/2018  . TUBAL LIGATION      Family History  Problem Relation Age of Onset  . Cancer Mother   . Cancer Father   . Healthy Sister   . Healthy Brother   . Healthy Sister    Social History:  reports that she has never smoked. She has never used smokeless tobacco. She reports that she does not drink alcohol or use drugs.  Allergies: No Known Allergies  Medications Prior to Admission  Medication Sig Dispense Refill  . aspirin EC 81 MG tablet Take 81 mg by mouth daily.    . Carboxymethylcellulose Sodium (THERATEARS OP) Place 1 drop into both eyes 2 (two) times daily as needed (dry eyes).    . hydrochlorothiazide (HYDRODIURIL) 25 MG tablet Take 25 mg by mouth daily.    . potassium chloride (KLOR-CON M10) 10 MEQ tablet Take 20 mEq by mouth 2 (two) times daily.    . pravastatin (PRAVACHOL) 10 MG tablet Take 10 mg by mouth at bedtime.       No results found for this or any previous visit (from the past 48 hour(s)). No results found.  ROS  Blood pressure (!) 146/77, pulse 79, temperature 97.6 F (36.4 C), temperature source Oral, resp. rate (!) 5, height 5\' 7"  (1.702 m), weight 60.8 kg, SpO2 100 %. Physical Exam  Constitutional: She appears well-developed and well-nourished.  HENT:  Mouth/Throat: Oropharynx is clear and moist.  Eyes: Conjunctivae are normal. No scleral icterus.  Neck: No thyromegaly present.   Cardiovascular: Normal rate, regular rhythm and normal heart sounds.  No murmur heard. Respiratory: Effort normal and breath sounds normal.  GI: Soft. She exhibits no distension and no mass. There is no abdominal tenderness.  Musculoskeletal:        General: No edema.  Neurological: She is alert.  Skin: Skin is warm and dry.     Assessment/Plan Positive Hemosure test. Diagnostic colonoscopy.   Lionel December, MD 07/16/2018, 11:26 AM

## 2018-07-16 NOTE — Discharge Instructions (Signed)
Resume usual medications including aspirin as before. High-fiber diet. No driving for 24 hours.   Colonoscopy, Adult, Care After This sheet gives you information about how to care for yourself after your procedure. Your health care provider may also give you more specific instructions. If you have problems or questions, contact your health care provider. Dr. Karilyn Cota:  [phone].  After hours and weekends call the hospital have have the GI doctor on call paged; they will call you back. What can I expect after the procedure? After the procedure, it is common to have:  A small amount of blood in your stool for 24 hours after the procedure.  Some gas.  Mild abdominal cramping or bloating. Follow these instructions at home: General instructions  For the first 24 hours after the procedure: ? Do not drive or use machinery. ? Do not sign important documents. ? Do not drink alcohol. ? Do your regular daily activities at a slower pace than normal. ? Eat soft, easy-to-digest foods.  Take over-the-counter or prescription medicines only as told by your health care provider. Relieving cramping and bloating   Try walking around when you have cramps or feel bloated. Eating and drinking   Drink enough fluid to keep your urine pale yellow.  Resume your normal diet as instructed by your health care provider.  Contact a health care provider if:  You have blood in your stool 2-3 days after the procedure. Get help right away if:  You have more than a small spotting of blood in your stool.  You pass large blood clots in your stool.  Your abdomen is swollen.  You have nausea or vomiting.  You have a fever.  You have increasing abdominal pain that is not relieved with medicine. Summary  After the procedure, it is common to have a small amount of blood in your stool. You may also have mild abdominal cramping and bloating.  For the first 24 hours after the procedure, do not drive or  use machinery, sign important documents, or drink alcohol.  Contact your health care provider if you have a lot of blood in your stool, nausea or vomiting, a fever, or increased abdominal pain. This information is not intended to replace advice given to you by your health care provider. Make sure you discuss any questions you have with your health care provider. Document Released: 12/29/2003 Document Revised: 03/08/2017 Document Reviewed: 07/28/2015 [employer]vier Interactive Patient Education  2019 [employer].

## 2018-07-16 NOTE — Op Note (Signed)
[facility] Patient Name: Lacey Tran Procedure Date: 07/16/2018 10:58 AM MRN: [id number] Date of Birth: May 10, 1946 Attending MD: Lionel December , MD CSN: [id number] Age: 73 Admit Type: Outpatient Procedure:                Colonoscopy Indications:              Heme positive stool Providers:                Lionel December, MD, Jannett Celestine, RN, Burke Keels,                            Technician Referring MD:             Estanislado Pandy, MD Medicines:                Meperidine 50 mg IV, Midazolam 6 mg IV Complications:            No immediate complications. Estimated Blood Loss:     Estimated blood loss: none. Procedure:                Pre-Anesthesia Assessment:                           - Prior to the procedure, a History and Physical                            was performed, and patient medications and                            allergies were reviewed. The patient's tolerance of                            previous anesthesia was also reviewed. The risks                            and benefits of the procedure and the sedation                            options and risks were discussed with the patient.                            All questions were answered, and informed consent                            was obtained. Prior Anticoagulants: The patient                            last took aspirin 3 days prior to the procedure.                            ASA Grade Assessment: II - A patient with mild                            systemic disease. After reviewing the risks and  benefits, the patient was deemed in satisfactory                            condition to undergo the procedure.                           After obtaining informed consent, the colonoscope                            was passed under direct vision. Throughout the                            procedure, the patient's blood pressure, pulse, and                            oxygen saturations  were monitored continuously. The                            PCF-H190DL ([id number]) scope was introduced through                            the anus and advanced to the the cecum, identified                            by appendiceal orifice and ileocecal valve. The                            colonoscopy was performed without difficulty. The                            patient tolerated the procedure well. The quality                            of the bowel preparation was excellent. The                            ileocecal valve, appendiceal orifice, and rectum                            were photographed. Scope In: 11:37:42 AM Scope Out: 11:59:41 AM Scope Withdrawal Time: 0 hours 12 minutes 33 seconds  Total Procedure Duration: 0 hours 21 minutes 59 seconds  Findings:      The perianal and digital rectal examinations were normal.      A few medium-mouthed diverticula were found in the sigmoid colon.      The exam was otherwise normal throughout the examined colon.      External hemorrhoids were found during retroflexion. The hemorrhoids       were small. Impression:               - Diverticulosis in the sigmoid colon.                           - External hemorrhoids.                           -  No specimens collected. Moderate Sedation:      Moderate (conscious) sedation was administered by the endoscopy nurse       and supervised by the endoscopist. The following parameters were       monitored: oxygen saturation, heart rate, blood pressure, CO2       capnography and response to care. Total physician intraservice time was       29 minutes. Recommendation:           - Patient has a contact number available for                            emergencies. The signs and symptoms of potential                            delayed complications were discussed with the                            patient. Return to normal activities tomorrow.                            Written discharge instructions  were provided to the                            patient.                           - High fiber diet today.                           - Continue present medications.                           -Resume aspirin at prior dose today.                           - No repeat colonoscopy due to age and the absence                            of colonic polyps. Procedure Code(s):        --- Professional ---                           [phone], Colonoscopy, flexible; diagnostic, including                            collection of specimen(s) by brushing or washing,                            when performed (separate procedure)                           99153, Moderate sedation; each additional 15                            minutes intraservice time  G0500, Moderate sedation services provided by the                            same physician or other qualified health care                            professional performing a gastrointestinal                            endoscopic service that sedation supports,                            requiring the presence of an independent trained                            observer to assist in the monitoring of the                            patient's level of consciousness and physiological                            status; initial 15 minutes of intra-service time;                            patient age 7 years or older (additional time may                            be reported with 33825, as appropriate) Diagnosis Code(s):        --- Professional ---                           K64.4, Residual hemorrhoidal skin tags                           R19.5, Other fecal abnormalities                           K57.30, Diverticulosis of large intestine without                            perforation or abscess without bleeding CPT copyright 2018 American Medical Association. All rights reserved. The codes documented in this report are preliminary and upon  coder review may  be revised to meet current compliance requirements. Lionel December, MD Lionel December, MD 07/16/2018 12:08:19 PM This report has been signed electronically. Number of Addenda: 0

## 2018-07-19 ENCOUNTER — Encounter (HOSPITAL_COMMUNITY): Payer: Self-pay | Admitting: Internal Medicine

## 2018-11-27 DIAGNOSIS — H35362 Drusen (degenerative) of macula, left eye: Secondary | ICD-10-CM | POA: Diagnosis not present

## 2018-11-27 DIAGNOSIS — H524 Presbyopia: Secondary | ICD-10-CM | POA: Diagnosis not present

## 2018-12-07 DIAGNOSIS — M109 Gout, unspecified: Secondary | ICD-10-CM | POA: Diagnosis not present

## 2018-12-07 DIAGNOSIS — I480 Paroxysmal atrial fibrillation: Secondary | ICD-10-CM | POA: Diagnosis not present

## 2018-12-07 DIAGNOSIS — E876 Hypokalemia: Secondary | ICD-10-CM | POA: Diagnosis not present

## 2018-12-07 DIAGNOSIS — I10 Essential (primary) hypertension: Secondary | ICD-10-CM | POA: Diagnosis not present

## 2018-12-07 DIAGNOSIS — E7800 Pure hypercholesterolemia, unspecified: Secondary | ICD-10-CM | POA: Diagnosis not present

## 2018-12-11 DIAGNOSIS — I480 Paroxysmal atrial fibrillation: Secondary | ICD-10-CM | POA: Diagnosis not present

## 2018-12-11 DIAGNOSIS — M109 Gout, unspecified: Secondary | ICD-10-CM | POA: Diagnosis not present

## 2018-12-11 DIAGNOSIS — E876 Hypokalemia: Secondary | ICD-10-CM | POA: Diagnosis not present

## 2018-12-11 DIAGNOSIS — I10 Essential (primary) hypertension: Secondary | ICD-10-CM | POA: Diagnosis not present

## 2018-12-11 DIAGNOSIS — E7800 Pure hypercholesterolemia, unspecified: Secondary | ICD-10-CM | POA: Diagnosis not present

## 2018-12-11 DIAGNOSIS — Z6823 Body mass index (BMI) 23.0-23.9, adult: Secondary | ICD-10-CM | POA: Diagnosis not present

## 2019-01-14 DIAGNOSIS — H25812 Combined forms of age-related cataract, left eye: Secondary | ICD-10-CM | POA: Diagnosis not present

## 2019-01-14 DIAGNOSIS — H25813 Combined forms of age-related cataract, bilateral: Secondary | ICD-10-CM | POA: Diagnosis not present

## 2019-01-14 DIAGNOSIS — H43813 Vitreous degeneration, bilateral: Secondary | ICD-10-CM | POA: Diagnosis not present

## 2019-01-14 DIAGNOSIS — H16223 Keratoconjunctivitis sicca, not specified as Sjogren's, bilateral: Secondary | ICD-10-CM | POA: Diagnosis not present

## 2019-02-13 NOTE — H&P (Signed)
Surgical History & Physical  Patient Name: Lacey Tran DOB: 02-28-1946  Surgery: Cataract extraction with intraocular lens implant phacoemulsification; Left Eye  Surgeon: Baruch Goldmann MD Surgery Date:  02/25/2019 Pre-Op Date:  02/08/2019  HPI: A 5 Yr. old female patient 1. The patient complains of difficulty when viewing TV, reading closed caption, news scrolls on TV, which began 2 years ago. Both eyes are affected. The episode is gradual. The condition's severity decreased since last visit. Symptoms occur when the patient is driving and reading. The complaint is associated with glare. HPI was performed by Baruch Goldmann .  Medical History:: Cataracts High Blood Pressure LDL  Review of Systems Negative Allergic/Immunologic Negative Cardiovascular Negative Constitutional Negative Ear, Nose, Mouth & Throat Negative Endocrine Negative Eyes Negative Gastrointestinal Negative Genitourinary Negative Hemotologic/Lymphatic Negative Integumentary Negative Musculoskeletal Negative Neurological Negative Psychiatry Negative Respiratory  Social   Never smoked   Medication Pot cl, HCTZ, Aspirin, Pravastatin,   Sx/Procedures  None  Drug Allergies   NKDA  History & Physical: Heent:  Cataract, Left eye NECK: supple without bruits LUNGS: lungs clear to auscultation CV: regular rate and rhythm Abdomen: soft and non-tender Impression & Plan: Assessment: 1.  COMBINED FORMS AGE RELATED CATARACT; Both Eyes (H25.813) 2.  VITREOUS DETACHMENT PVD; Both Eyes (H43.813) 3.  Keratoconjunctivitis Sicca; Both Eyes (V40.086)  Plan: 1.  Cataract accounts for the patient's decreased vision. This visual impairment is not correctable with a tolerable change in glasses or contact lenses. Cataract surgery with an implantation of a new lens should significantly improve the visual and functional status of the patient. Discussed all risks, benefits, alternatives, and potential complications.  Discussed the procedures and recovery. Patient desires to have surgery. A-scan ordered and performed today for intra-ocular lens calculations. The surgery will be performed in order to improve vision for driving, reading, and for eye examinations. Recommend phacoemulsification with intra-ocular lens. Left Eye worse - first. Dilates well - shugarcaine by protocol. 2.  Old Asymptomatic. RD precautions given. Patient to call with increase in flashing lights/floaters/dark curtain. Symptomatic. 3.  Artificial tears 2-8x/day PRN for symptoms

## 2019-02-18 NOTE — Patient Instructions (Signed)
Lacey Tran  02/18/2019     @PREFPERIOPPHARMACY @   Your procedure is scheduled on  02/25/2019.  Report to [facility] at  [place].M.  Call this number if you have problems the morning of surgery:  [phone]   Remember:  Do not eat or drink after midnight.                        Take these medicines the morning of surgery with A SIP OF WATER  NONE    Do not wear jewelry, make-up or nail polish.  Do not wear lotions, powders, or perfumes. Please wear deodorant and brush your teeth.  Do not shave 48 hours prior to surgery.  Men may shave face and neck.  Do not bring valuables to the hospital.  [facility] is not responsible for any belongings or valuables.  Contacts, dentures or bridgework may not be worn into surgery.  Leave your suitcase in the car.  After surgery it may be brought to your room.  For patients admitted to the hospital, discharge time will be determined by your treatment team.  Patients discharged the day of surgery will not be allowed to drive home.   Name and phone number of your driver:   family Special instructions:  None  Please read over the following fact sheets that you were given. Anesthesia Post-op Instructions and Care and Recovery After Surgery       Cataract Surgery, Care After This sheet gives you information about how to care for yourself after your procedure. Your health care provider may also give you more specific instructions. If you have problems or questions, contact your health care provider. What can I expect after the procedure? After the procedure, it is common to have:  Itching.  Discomfort.  Fluid discharge.  Sensitivity to light and to touch.  Bruising in or around the eye.  Mild blurred vision. Follow these instructions at home: Eye care   Do not touch or rub your eyes.  Protect your eyes as told by your health care provider. You may be told to wear a protective eye shield or sunglasses.  Do not  put a contact lens into the affected eye or eyes until your health care provider approves.  Keep the area around your eye clean and dry: ? Avoid swimming. ? Do not allow water to hit you directly in the face while showering. ? Keep soap and shampoo out of your eyes.  Check your eye every day for signs of infection. Watch for: ? Redness, swelling, or pain. ? Fluid, blood, or pus. ? Warmth. ? A bad smell. ? Vision that is getting worse. ? Sensitivity that is getting worse. Activity  Do not drive for 24 hours if you were given a sedative during your procedure.  Avoid strenuous activities, such as playing contact sports, for as long as told by your health care provider.  Do not drive or use heavy machinery until your health care provider approves.  Do not bend or lift heavy objects. Bending increases pressure in the eye. You can walk, climb stairs, and do light household chores.  Ask your health care provider when you can return to work. If you work in a dusty environment, you may be advised to wear protective eyewear for a period of time. General instructions  Take or apply over-the-counter and prescription medicines only as told by your health care provider. This includes  eye drops.  Keep all follow-up visits as told by your health care provider. This is important. Contact a health care provider if:  You have increased bruising around your eye.  You have pain that is not helped with medicine.  You have a fever.  You have redness, swelling, or pain in your eye.  You have fluid, blood, or pus coming from your incision.  Your vision gets worse.  Your sensitivity to light gets worse. Get help right away if:  You have sudden loss of vision.  You see flashes of light or spots (floaters).  You have severe eye pain.  You develop nausea or vomiting. Summary  After your procedure, it is common to have itching, discomfort, bruising, fluid discharge, or sensitivity to  light.  Follow instructions from your health care provider about caring for your eye after the procedure.  Do not rub your eye after the procedure. You may need to wear eye protection or sunglasses. Do not wear contact lenses. Keep the area around your eye clean and dry.  Avoid activities that require a lot of effort. These include playing sports and lifting heavy objects.  Contact a health care provider if you have increased bruising, pain that does not go away, or a fever. Get help right away if you suddenly lose your vision, see flashes of light or spots, or have severe pain in the eye. This information is not intended to replace advice given to you by your health care provider. Make sure you discuss any questions you have with your health care provider. Document Released: 12/03/2004 Document Revised: 11/13/2017 Document Reviewed: 11/13/2017 Elsevier Patient Education  2020 [employer] After These instructions provide you with information about caring for yourself after your procedure. Your health care provider may also give you more specific instructions. Your treatment has been planned according to current medical practices, but problems sometimes occur. Call your health care provider if you have any problems or questions after your procedure. What can I expect after the procedure? After your procedure, you may:  Feel sleepy for several hours.  Feel clumsy and have poor balance for several hours.  Feel forgetful about what happened after the procedure.  Have poor judgment for several hours.  Feel nauseous or vomit.  Have a sore throat if you had a breathing tube during the procedure. Follow these instructions at home: For at least 24 hours after the procedure:      Have a responsible adult stay with you. It is important to have someone help care for you until you are awake and alert.  Rest as needed.  Do not: ? Participate in activities  in which you could fall or become injured. ? Drive. ? Use heavy machinery. ? Drink alcohol. ? Take sleeping pills or medicines that cause drowsiness. ? Make important decisions or sign legal documents. ? Take care of children on your own. Eating and drinking  Follow the diet that is recommended by your health care provider.  If you vomit, drink water, juice, or soup when you can drink without vomiting.  Make sure you have little or no nausea before eating solid foods. General instructions  Take over-the-counter and prescription medicines only as told by your health care provider.  If you have sleep apnea, surgery and certain medicines can increase your risk for breathing problems. Follow instructions from your health care provider about wearing your sleep device: ? Anytime you are sleeping, including during daytime naps. ? While  taking prescription pain medicines, sleeping medicines, or medicines that make you drowsy.  If you smoke, do not smoke without supervision.  Keep all follow-up visits as told by your health care provider. This is important. Contact a health care provider if:  You keep feeling nauseous or you keep vomiting.  You feel light-headed.  You develop a rash.  You have a fever. Get help right away if:  You have trouble breathing. Summary  For several hours after your procedure, you may feel sleepy and have poor judgment.  Have a responsible adult stay with you for at least 24 hours or until you are awake and alert. This information is not intended to replace advice given to you by your health care provider. Make sure you discuss any questions you have with your health care provider. Document Released: 09/06/2015 Document Revised: 08/14/2017 Document Reviewed: 09/06/2015 Elsevier Patient Education  2020 [employer].

## 2019-02-21 ENCOUNTER — Other Ambulatory Visit (HOSPITAL_COMMUNITY)
Admission: RE | Admit: 2019-02-21 | Discharge: 2019-02-21 | Disposition: A | Discharge status: Home / Self Care | Payer: Medicare Other | Source: Ambulatory Visit | Attending: Ophthalmology | Admitting: Ophthalmology

## 2019-02-21 ENCOUNTER — Encounter (HOSPITAL_COMMUNITY)
Admission: RE | Admit: 2019-02-21 | Discharge: 2019-02-21 | Disposition: A | Discharge status: Home / Self Care | Payer: Medicare Other | Source: Ambulatory Visit | Attending: Ophthalmology | Admitting: Ophthalmology

## 2019-02-21 ENCOUNTER — Encounter (HOSPITAL_COMMUNITY): Payer: Self-pay

## 2019-02-21 ENCOUNTER — Other Ambulatory Visit: Payer: Self-pay

## 2019-02-21 DIAGNOSIS — Z01812 Encounter for preprocedural laboratory examination: Secondary | ICD-10-CM | POA: Insufficient documentation

## 2019-02-21 DIAGNOSIS — Z20828 Contact with and (suspected) exposure to other viral communicable diseases: Secondary | ICD-10-CM | POA: Insufficient documentation

## 2019-02-21 HISTORY — DX: Gout, unspecified: M10.9

## 2019-02-21 HISTORY — DX: Unspecified macular degeneration: H35.30

## 2019-02-21 LAB — BASIC METABOLIC PANEL
Anion gap: 9 (ref 5–15)
BUN: 18 mg/dL (ref 8–23)
CO2: 29 mmol/L (ref 22–32)
Calcium: 9.6 mg/dL (ref 8.9–10.3)
Chloride: 98 mmol/L (ref 98–111)
Creatinine, Ser: 0.91 mg/dL (ref 0.44–1.00)
GFR calc Af Amer: >60 mL/min (ref >60)
GFR calc non Af Amer: >60 mL/min (ref >60)
Glucose, Bld: 93 mg/dL (ref 70–99)
Potassium: 3 mmol/L — ABNORMAL LOW (ref 3.5–5.1)
Sodium: 136 mmol/L (ref 135–145)

## 2019-02-21 LAB — SARS CORONAVIRUS 2 (TAT 6-24 HRS): SARS Coronavirus 2: NEGATIVE

## 2019-02-21 NOTE — Pre-Procedure Instructions (Signed)
Results of BMet routed to PCP.

## 2019-02-22 MED ORDER — NEOMYCIN-POLYMYXIN-DEXAMETH 3.5-10000-0.1 OP SUSP
OPHTHALMIC | Status: AC
  Filled 2019-02-22: qty 5

## 2019-02-25 ENCOUNTER — Ambulatory Visit (HOSPITAL_COMMUNITY): Payer: Medicare Other | Admitting: Anesthesiology

## 2019-02-25 ENCOUNTER — Encounter (HOSPITAL_COMMUNITY)
Admission: RE | Disposition: A | Discharge status: Home / Self Care | Payer: Self-pay | Source: Home / Self Care | Attending: Ophthalmology

## 2019-02-25 ENCOUNTER — Ambulatory Visit (HOSPITAL_COMMUNITY)
Admission: RE | Admit: 2019-02-25 | Discharge: 2019-02-25 | Disposition: A | Discharge status: Home / Self Care | Payer: Medicare Other | Attending: Ophthalmology | Admitting: Ophthalmology

## 2019-02-25 DIAGNOSIS — H16223 Keratoconjunctivitis sicca, not specified as Sjogren's, bilateral: Secondary | ICD-10-CM | POA: Diagnosis not present

## 2019-02-25 DIAGNOSIS — H3343 Traction detachment of retina, bilateral: Secondary | ICD-10-CM | POA: Diagnosis not present

## 2019-02-25 DIAGNOSIS — Z7982 Long term (current) use of aspirin: Secondary | ICD-10-CM | POA: Diagnosis not present

## 2019-02-25 DIAGNOSIS — H25812 Combined forms of age-related cataract, left eye: Secondary | ICD-10-CM | POA: Diagnosis not present

## 2019-02-25 DIAGNOSIS — I10 Essential (primary) hypertension: Secondary | ICD-10-CM | POA: Diagnosis not present

## 2019-02-25 DIAGNOSIS — Z79899 Other long term (current) drug therapy: Secondary | ICD-10-CM | POA: Insufficient documentation

## 2019-02-25 HISTORY — PX: CATARACT EXTRACTION W/PHACO: SHX586

## 2019-02-25 SURGERY — PHACOEMULSIFICATION, CATARACT, WITH IOL INSERTION
Anesthesia: Monitor Anesthesia Care | Site: Eye | Laterality: Left

## 2019-02-25 MED ORDER — LIDOCAINE HCL 3.5 % OP GEL
1.0000 "application " | Freq: Once | OPHTHALMIC | Status: AC
  Administered 2019-02-25: 1 via OPHTHALMIC

## 2019-02-25 MED ORDER — CYCLOPENTOLATE-PHENYLEPHRINE 0.2-1 % OP SOLN
1.0000 [drp] | OPHTHALMIC | Status: AC | PRN
  Administered 2019-02-25 (×3): 1 [drp] via OPHTHALMIC

## 2019-02-25 MED ORDER — POVIDONE-IODINE 5 % OP SOLN
OPHTHALMIC | Status: DC | PRN
  Administered 2019-02-25: 1 via OPHTHALMIC

## 2019-02-25 MED ORDER — LIDOCAINE HCL (PF) 1 % IJ SOLN
INTRAOCULAR | Status: DC | PRN
  Administered 2019-02-25: 1 mL via OPHTHALMIC

## 2019-02-25 MED ORDER — EPINEPHRINE PF 1 MG/ML IJ SOLN
INTRAOCULAR | Status: DC | PRN
  Administered 2019-02-25: 12:00:00 500 mL

## 2019-02-25 MED ORDER — NEOMYCIN-POLYMYXIN-DEXAMETH 3.5-10000-0.1 OP SUSP
OPHTHALMIC | Status: DC | PRN
  Administered 2019-02-25: 2 [drp] via OPHTHALMIC

## 2019-02-25 MED ORDER — BSS IO SOLN
INTRAOCULAR | Status: DC | PRN
  Administered 2019-02-25: 15 mL via INTRAOCULAR

## 2019-02-25 MED ORDER — TETRACAINE HCL 0.5 % OP SOLN
1.0000 [drp] | OPHTHALMIC | Status: AC | PRN
  Administered 2019-02-25 (×3): 1 [drp] via OPHTHALMIC

## 2019-02-25 MED ORDER — EPINEPHRINE PF 1 MG/ML IJ SOLN
INTRAMUSCULAR | Status: AC
  Filled 2019-02-25: qty 2

## 2019-02-25 MED ORDER — PHENYLEPHRINE HCL 2.5 % OP SOLN
1.0000 [drp] | OPHTHALMIC | Status: AC | PRN
  Administered 2019-02-25 (×3): 1 [drp] via OPHTHALMIC

## 2019-02-25 MED ORDER — PROVISC 10 MG/ML IO SOLN
INTRAOCULAR | Status: DC | PRN
  Administered 2019-02-25: 0.85 mL via INTRAOCULAR

## 2019-02-25 MED ORDER — SODIUM HYALURONATE 23 MG/ML IO SOLN
INTRAOCULAR | Status: DC | PRN
  Administered 2019-02-25: 0.6 mL via INTRAOCULAR

## 2019-02-25 SURGICAL SUPPLY — 14 items
CLOTH BEACON ORANGE TIMEOUT ST (SAFETY) ×3 IMPLANT
EYE SHIELD UNIVERSAL CLEAR (GAUZE/BANDAGES/DRESSINGS) ×3 IMPLANT
GLOVE BIOGEL PI IND STRL 6.5 (GLOVE) ×1 IMPLANT
GLOVE BIOGEL PI IND STRL 7.0 (GLOVE) ×1 IMPLANT
GLOVE BIOGEL PI INDICATOR 6.5 (GLOVE) ×2
GLOVE BIOGEL PI INDICATOR 7.0 (GLOVE) ×2
LENS ALC ACRYL/TECN (Ophthalmic Related) ×3 IMPLANT
NEEDLE HYPO 18GX1.5 BLUNT FILL (NEEDLE) ×3 IMPLANT
PAD ARMBOARD 7.5X6 YLW CONV (MISCELLANEOUS) ×3 IMPLANT
SYR TB 1ML LL NO SAFETY (SYRINGE) ×3 IMPLANT
TAPE SURG TRANSPORE 1 IN (GAUZE/BANDAGES/DRESSINGS) ×1 IMPLANT
TAPE SURGICAL TRANSPORE 1 IN (GAUZE/BANDAGES/DRESSINGS) ×2
VISCOELASTIC ADDITIONAL (OPHTHALMIC RELATED) ×3 IMPLANT
WATER STERILE IRR 250ML POUR (IV SOLUTION) ×3 IMPLANT

## 2019-02-25 NOTE — Interval H&P Note (Signed)
History and Physical Interval Note: The H and P was reviewed and updated. The patient was examined.  No changes were found after exam.  The surgical eye was marked.  02/25/2019 11:33 AM  Lacey Tran  has presented today for surgery, with the diagnosis of Nuclear Sclerotic cataract - Left eye.  The various methods of treatment have been discussed with the patient and family. After consideration of risks, benefits and other options for treatment, the patient has consented to  Procedure(s) with comments: CATARACT EXTRACTION PHACO AND INTRAOCULAR LENS PLACEMENT ([place]) (Left) - left as a surgical intervention.  The patient's history has been reviewed, patient examined, no change in status, stable for surgery.  I have reviewed the patient's chart and labs.  Questions were answered to the patient's satisfaction.     Baruch Goldmann

## 2019-02-25 NOTE — Discharge Instructions (Signed)
Please discharge patient when stable, will follow up today with Dr. Ladora Osterberg at the [facility] office immediately following discharge.  Leave shield in place until visit.  All paperwork with discharge instructions will be given at the office. ° °

## 2019-02-25 NOTE — Anesthesia Postprocedure Evaluation (Signed)
Anesthesia Post Note  Patient: CESIAH WESTLEY  Procedure(s) Performed: CATARACT EXTRACTION PHACO AND INTRAOCULAR LENS PLACEMENT ([facility]) (Left Eye)  Patient location during evaluation: Short Stay Anesthesia Type: MAC Level of consciousness: awake and alert and oriented Pain management: pain level controlled Vital Signs Assessment: post-procedure vital signs reviewed and stable Respiratory status: spontaneous breathing Cardiovascular status: stable Postop Assessment: no apparent nausea or vomiting Anesthetic complications: no     Last Vitals:  Vitals:   02/25/19 1040  BP: 136/90  Pulse: 74  Resp: 18  Temp: 36.7 C  SpO2: 98%    Last Pain:  Vitals:   02/25/19 1040  PainSc: 0-No pain                 ADAMS, AMY A

## 2019-02-25 NOTE — Transfer of Care (Signed)
Immediate Anesthesia Transfer of Care Note  Patient: Lacey Tran  Procedure(s) Performed: CATARACT EXTRACTION PHACO AND INTRAOCULAR LENS PLACEMENT ([facility]) (Left Eye)  Patient Location: Short Stay  Anesthesia Type:MAC  Level of Consciousness: awake, alert , oriented and patient cooperative  Airway & Oxygen Therapy: Patient Spontanous Breathing  Post-op Assessment: Report given to RN and Post -op Vital signs reviewed and stable  Post vital signs: Reviewed and stable  Last Vitals:  Vitals Value Taken Time  BP    Temp    Pulse    Resp    SpO2      Last Pain:  Vitals:   02/25/19 1040  PainSc: 0-No pain         Complications: No apparent anesthesia complications

## 2019-02-25 NOTE — Op Note (Signed)
Date of procedure: 02/25/19  Pre-operative diagnosis: Visually significant age-related combined cataract, Left Eye (H25.812)  Post-operative diagnosis: Visually significant age-related combined cataract, Left Eye (H25.812)  Procedure: Removal of cataract via phacoemulsification and insertion of intra-ocular lens [employer] PCB00  +20.5D into the capsular bag of the Left Eye  Attending surgeon: Gerda Diss. Christyne Mccain, MD, MA  Anesthesia: MAC, Topical Akten  Complications: None  Estimated Blood Loss: <64m (minimal)  Specimens: None  Implants: As above  Indications:  Visually significant age-related cataract, Left Eye  Procedure:  The patient was seen and identified in the pre-operative area. The operative eye was identified and dilated.  The operative eye was marked.  Topical anesthesia was administered to the operative eye.     The patient was then to the operative suite and placed in the supine position.  A timeout was performed confirming the patient, procedure to be performed, and all other relevant information.   The patient's face was prepped and draped in the usual fashion for intra-ocular surgery.  A lid speculum was placed into the operative eye and the surgical microscope moved into place and focused.  An inferotemporal paracentesis was created using a 20 gauge paracentesis blade.  Shugarcaine was injected into the anterior chamber.  Viscoelastic was injected into the anterior chamber.  A temporal clear-corneal main wound incision was created using a 2.432mmicrokeratome.  A continuous curvilinear capsulorrhexis was initiated using an irrigating cystitome and completed using capsulorrhexis forceps.  Hydrodissection and hydrodeliniation were performed.  Viscoelastic was injected into the anterior chamber.  A phacoemulsification handpiece and a chopper as a second instrument were used to remove the nucleus and epinucleus. The irrigation/aspiration handpiece was used to remove  any remaining cortical material.   The capsular bag was reinflated with viscoelastic, checked, and found to be intact.  The intraocular lens was inserted into the capsular bag.  The irrigation/aspiration handpiece was used to remove any remaining viscoelastic.  The clear corneal wound and paracentesis wounds were then hydrated and checked with Weck-Cels to be watertight.  The lid-speculum and drape was removed, and the patient's face was cleaned with a wet and dry 4x4.  Maxitrol was instilled in the eye before a clear shield was taped over the eye. The patient was taken to the post-operative care unit in good condition, having tolerated the procedure well.  Post-Op Instructions: [facility] and will receive all other orders and instructions.

## 2019-02-25 NOTE — Anesthesia Preprocedure Evaluation (Signed)
Anesthesia Evaluation  Patient identified by MRN, date of birth, ID band Patient awake    Reviewed: Allergy & Precautions, NPO status , Patient's Chart, lab work & pertinent test results  Airway Mallampati: II  TM Distance: >3 FB Neck ROM: Full    Dental no notable dental hx. (+) Edentulous Upper, Edentulous Lower   Pulmonary pneumonia, resolved,    Pulmonary exam normal breath sounds clear to auscultation       Cardiovascular Exercise Tolerance: Good hypertension, Pt. on medications Normal cardiovascular exam+ dysrhythmias Atrial Fibrillation I Rhythm:Regular Rate:Normal  H/o Afib -appears sinus today denies DCCV in past  Reports good ET  Denies CP/MI/DOE   Neuro/Psych negative neurological ROS  negative psych ROS   GI/Hepatic negative GI ROS, Neg liver ROS,   Endo/Other  negative endocrine ROS  Renal/GU negative Renal ROS  negative genitourinary   Musculoskeletal negative musculoskeletal ROS (+)   Abdominal   Peds negative pediatric ROS (+)  Hematology negative hematology ROS (+)   Anesthesia Other Findings   Reproductive/Obstetrics negative OB ROS                             Anesthesia Physical Anesthesia Plan  ASA: II  Anesthesia Plan: MAC   Post-op Pain Management:    Induction: Intravenous  PONV Risk Score and Plan: 2 and Treatment may vary due to age or medical condition, TIVA and Ondansetron  Airway Management Planned: Nasal Cannula and Simple Face Mask  Additional Equipment:   Intra-op Plan:   Post-operative Plan:   Informed Consent: I have reviewed the patients History and Physical, chart, labs and discussed the procedure including the risks, benefits and alternatives for the proposed anesthesia with the patient or authorized representative who has indicated his/her understanding and acceptance.     Dental advisory given  Plan Discussed with:  CRNA  Anesthesia Plan Comments: (Plan Full PPE use  Plan MAC as tolerated ,with GETA as needed d/w pt -WTP with same after Q&A)        Anesthesia Quick Evaluation

## 2019-02-26 ENCOUNTER — Encounter (HOSPITAL_COMMUNITY): Payer: Self-pay | Admitting: Ophthalmology

## 2019-03-04 DIAGNOSIS — H25811 Combined forms of age-related cataract, right eye: Secondary | ICD-10-CM | POA: Diagnosis not present

## 2019-03-06 ENCOUNTER — Encounter (HOSPITAL_COMMUNITY): Payer: Self-pay

## 2019-03-06 ENCOUNTER — Encounter (HOSPITAL_COMMUNITY)
Admission: RE | Admit: 2019-03-06 | Discharge: 2019-03-06 | Disposition: A | Discharge status: Home / Self Care | Payer: Medicare Other | Source: Ambulatory Visit | Attending: Ophthalmology | Admitting: Ophthalmology

## 2019-03-06 ENCOUNTER — Other Ambulatory Visit: Payer: Self-pay

## 2019-03-06 NOTE — H&P (Signed)
Surgical History & Physical  Patient Name: Lacey Tran DOB: 1945/07/06  Surgery: Cataract extraction with intraocular lens implant phacoemulsification; Right Eye  Surgeon: Baruch Goldmann MD Surgery Date:  03/11/2019 Pre-Op Date:  03/04/2019  HPI: A 44 Yr. old female patient 1. 1. The patient is returning after cataract surgery. The left eye is affected. Status post cataract surgery on 02-25-2019: Onset was S/P PC IOL. Since the last visit, the affected area feels improvement and is doing well. The patient's vision is improved. Patient is following medication instructions 3 in 1 drops TID (BID as of today). Patient having difficulty with OU working together. OS much clearer and doing most of the work. OD blurred and hazy. This is negatively affecting the patient's quality of life. HPI Completed by Dr. Baruch Goldmann  Medical History: Cataracts Macula Degeneration High Blood Pressure LDL  Review of Systems Negative Allergic/Immunologic Negative Cardiovascular Negative Constitutional Negative Ear, Nose, Mouth & Throat Negative Endocrine Negative Eyes Negative Gastrointestinal Negative Genitourinary Negative Hemotologic/Lymphatic Negative Integumentary Negative Musculoskeletal Negative Neurological Negative Psychiatry Negative Respiratory  Social   Never smoked  Medication Prednisolon-gatiflox-bromfenac,  Pot cl, HCTZ, Aspirin, Pravastatin,   Sx/Procedures Phaco c IOL,   Drug Allergies   NKDA  History & Physical: Heent:  Cataract, Right eye NECK: supple without bruits LUNGS: lungs clear to auscultation CV: regular rate and rhythm Abdomen: soft and non-tender  Impression & Plan: Assessment: 1.  CATARACT EXTRACTION STATUS; Left Eye (Z98.42) 2.  COMBINED FORMS AGE RELATED CATARACT; Right Eye (H25.811)  Plan: 1.  1 week after cataract surgery. Doing well with improved vision and normal eye pressure. Call with any problems or concerns. Continue Gati-Brom-Pred 2x/day  for 3 more weeks. 2.  Dilates well - shugarcaine by protocol. Cataract accounts for the patient's decreased vision. This visual impairment is not correctable with a tolerable change in glasses or contact lenses. Cataract surgery with an implantation of a new lens should significantly improve the visual and functional status of the patient. Discussed all risks, benefits, alternatives, and potential complications. Discussed the procedures and recovery. Patient desires to have surgery. A-scan ordered and performed today for intra-ocular lens calculations. The surgery will be performed in order to improve vision for driving, reading, and for eye examinations. Recommend phacoemulsification with intra-ocular lens. Right Eye. Surgery required to correct imbalance of vision.

## 2019-03-08 ENCOUNTER — Other Ambulatory Visit (HOSPITAL_COMMUNITY)
Admission: RE | Admit: 2019-03-08 | Discharge: 2019-03-08 | Disposition: A | Discharge status: Home / Self Care | Payer: Medicare Other | Source: Ambulatory Visit | Attending: Ophthalmology | Admitting: Ophthalmology

## 2019-03-08 ENCOUNTER — Other Ambulatory Visit: Payer: Self-pay

## 2019-03-08 DIAGNOSIS — Z01812 Encounter for preprocedural laboratory examination: Secondary | ICD-10-CM | POA: Diagnosis not present

## 2019-03-08 DIAGNOSIS — Z20828 Contact with and (suspected) exposure to other viral communicable diseases: Secondary | ICD-10-CM | POA: Insufficient documentation

## 2019-03-08 LAB — SARS CORONAVIRUS 2 (TAT 6-24 HRS): SARS Coronavirus 2: NEGATIVE

## 2019-03-11 ENCOUNTER — Ambulatory Visit (HOSPITAL_COMMUNITY): Payer: Medicare Other | Admitting: Anesthesiology

## 2019-03-11 ENCOUNTER — Encounter (HOSPITAL_COMMUNITY): Payer: Self-pay | Admitting: Anesthesiology

## 2019-03-11 ENCOUNTER — Ambulatory Visit (HOSPITAL_COMMUNITY)
Admission: RE | Admit: 2019-03-11 | Discharge: 2019-03-11 | Disposition: A | Discharge status: Home / Self Care | Payer: Medicare Other | Attending: Ophthalmology | Admitting: Ophthalmology

## 2019-03-11 ENCOUNTER — Encounter (HOSPITAL_COMMUNITY)
Admission: RE | Disposition: A | Discharge status: Home / Self Care | Payer: Self-pay | Source: Home / Self Care | Attending: Ophthalmology

## 2019-03-11 DIAGNOSIS — Z9842 Cataract extraction status, left eye: Secondary | ICD-10-CM | POA: Insufficient documentation

## 2019-03-11 DIAGNOSIS — H25811 Combined forms of age-related cataract, right eye: Secondary | ICD-10-CM | POA: Insufficient documentation

## 2019-03-11 DIAGNOSIS — I10 Essential (primary) hypertension: Secondary | ICD-10-CM | POA: Diagnosis not present

## 2019-03-11 DIAGNOSIS — H3530 Unspecified macular degeneration: Secondary | ICD-10-CM | POA: Diagnosis not present

## 2019-03-11 DIAGNOSIS — Z7982 Long term (current) use of aspirin: Secondary | ICD-10-CM | POA: Insufficient documentation

## 2019-03-11 DIAGNOSIS — H2511 Age-related nuclear cataract, right eye: Secondary | ICD-10-CM | POA: Diagnosis not present

## 2019-03-11 DIAGNOSIS — Z79899 Other long term (current) drug therapy: Secondary | ICD-10-CM | POA: Diagnosis not present

## 2019-03-11 HISTORY — PX: CATARACT EXTRACTION W/PHACO: SHX586

## 2019-03-11 SURGERY — PHACOEMULSIFICATION, CATARACT, WITH IOL INSERTION
Anesthesia: Monitor Anesthesia Care | Site: Eye | Laterality: Right

## 2019-03-11 MED ORDER — LIDOCAINE HCL 3.5 % OP GEL
1.0000 "application " | Freq: Once | OPHTHALMIC | Status: AC
  Administered 2019-03-11: 1 via OPHTHALMIC

## 2019-03-11 MED ORDER — TETRACAINE HCL 0.5 % OP SOLN
1.0000 [drp] | OPHTHALMIC | Status: AC | PRN
  Administered 2019-03-11 (×3): 1 [drp] via OPHTHALMIC

## 2019-03-11 MED ORDER — SODIUM HYALURONATE 23 MG/ML IO SOLN
INTRAOCULAR | Status: DC | PRN
  Administered 2019-03-11: 0.6 mL via INTRAOCULAR

## 2019-03-11 MED ORDER — PROVISC 10 MG/ML IO SOLN
INTRAOCULAR | Status: DC | PRN
  Administered 2019-03-11: 0.85 mL via INTRAOCULAR

## 2019-03-11 MED ORDER — LIDOCAINE HCL (PF) 1 % IJ SOLN
INTRAOCULAR | Status: DC | PRN
  Administered 2019-03-11: 1 mL via OPHTHALMIC

## 2019-03-11 MED ORDER — CYCLOPENTOLATE-PHENYLEPHRINE 0.2-1 % OP SOLN
1.0000 [drp] | OPHTHALMIC | Status: AC | PRN
  Administered 2019-03-11 (×3): 1 [drp] via OPHTHALMIC

## 2019-03-11 MED ORDER — POVIDONE-IODINE 5 % OP SOLN
OPHTHALMIC | Status: DC | PRN
  Administered 2019-03-11: 1 via OPHTHALMIC

## 2019-03-11 MED ORDER — NEOMYCIN-POLYMYXIN-DEXAMETH 3.5-10000-0.1 OP SUSP
OPHTHALMIC | Status: DC | PRN
  Administered 2019-03-11: 2 [drp] via OPHTHALMIC

## 2019-03-11 MED ORDER — BSS IO SOLN
INTRAOCULAR | Status: DC | PRN
  Administered 2019-03-11: 15 mL via INTRAOCULAR

## 2019-03-11 MED ORDER — EPINEPHRINE PF 1 MG/ML IJ SOLN
INTRAOCULAR | Status: DC | PRN
  Administered 2019-03-11: 500 mL

## 2019-03-11 MED ORDER — PHENYLEPHRINE HCL 2.5 % OP SOLN
1.0000 [drp] | OPHTHALMIC | Status: AC | PRN
  Administered 2019-03-11 (×3): 1 [drp] via OPHTHALMIC

## 2019-03-11 SURGICAL SUPPLY — 12 items

## 2019-03-11 NOTE — Op Note (Signed)
Date of procedure: 03/11/19  Pre-operative diagnosis:  Visually significant combined form age-related cataract, Right Eye (H25.811)  Post-operative diagnosis:  Visually significant combined form age-related cataract, Right Eye (H25.811)  Procedure: Removal of cataract via phacoemulsification and insertion of intra-ocular lens [employer] PCB00  +20.0D into the capsular bag of the Right Eye  Attending surgeon: Gerda Diss. Kaydan Wong, MD, MA  Anesthesia: MAC, Topical Akten  Complications: None  Estimated Blood Loss: <27m (minimal)  Specimens: None  Implants: As above  Indications:  Visually significant age-related cataract, Right Eye  Procedure:  The patient was seen and identified in the pre-operative area. The operative eye was identified and dilated.  The operative eye was marked.  Topical anesthesia was administered to the operative eye.     The patient was then to the operative suite and placed in the supine position.  A timeout was performed confirming the patient, procedure to be performed, and all other relevant information.   The patient's face was prepped and draped in the usual fashion for intra-ocular surgery.  A lid speculum was placed into the operative eye and the surgical microscope moved into place and focused.  A superotemporal paracentesis was created using a 20 gauge paracentesis blade.  Shugarcaine was injected into the anterior chamber.  Viscoelastic was injected into the anterior chamber.  A temporal clear-corneal main wound incision was created using a 2.459mmicrokeratome.  A continuous curvilinear capsulorrhexis was initiated using an irrigating cystitome and completed using capsulorrhexis forceps.  Hydrodissection and hydrodeliniation were performed.  Viscoelastic was injected into the anterior chamber.  A phacoemulsification handpiece and a chopper as a second instrument were used to remove the nucleus and epinucleus. The irrigation/aspiration handpiece was  used to remove any remaining cortical material.   The capsular bag was reinflated with viscoelastic, checked, and found to be intact.  The intraocular lens was inserted into the capsular bag.  The irrigation/aspiration handpiece was used to remove any remaining viscoelastic.  The clear corneal wound and paracentesis wounds were then hydrated and checked with Weck-Cels to be watertight.  The lid-speculum and drape was removed, and the patient's face was cleaned with a wet and dry 4x4.  Maxitrol was instilled in the eye before a clear shield was taped over the eye. The patient was taken to the post-operative care unit in good condition, having tolerated the procedure well.  Post-Op Instructions: [facility] and will receive all other orders and instructions.

## 2019-03-11 NOTE — Anesthesia Postprocedure Evaluation (Signed)
Anesthesia Post Note  Patient: Lacey Tran  Procedure(s) Performed: CATARACT EXTRACTION PHACO AND INTRAOCULAR LENS PLACEMENT (IOC) (Right Eye)  Patient location during evaluation: Short Stay Anesthesia Type: MAC Level of consciousness: awake and alert and oriented Pain management: pain level controlled Vital Signs Assessment: post-procedure vital signs reviewed and stable Respiratory status: spontaneous breathing Cardiovascular status: blood pressure returned to baseline Postop Assessment: no apparent nausea or vomiting Anesthetic complications: no     Last Vitals:  Vitals:   03/11/19 0715  BP: (!) 150/75  Resp: 18  Temp: 36.9 C  SpO2: 100%    Last Pain:  Vitals:   03/11/19 0715  PainSc: 0-No pain                 Gilberte Gorley

## 2019-03-11 NOTE — Anesthesia Preprocedure Evaluation (Signed)
Anesthesia Evaluation  Patient identified by MRN, date of birth, ID band Patient awake    Reviewed: Allergy & Precautions, NPO status , Patient's Chart, lab work & pertinent test results  Airway Mallampati: II  TM Distance: >3 FB Neck ROM: Full    Dental no notable dental hx. (+) Edentulous Upper   Pulmonary pneumonia, resolved,    Pulmonary exam normal breath sounds clear to auscultation       Cardiovascular Exercise Tolerance: Good hypertension, Pt. on medications negative cardio ROS Normal cardiovascular examI Rhythm:Regular Rate:Normal     Neuro/Psych negative neurological ROS  negative psych ROS   GI/Hepatic negative GI ROS, Neg liver ROS,   Endo/Other  negative endocrine ROS  Renal/GU negative Renal ROS  negative genitourinary   Musculoskeletal negative musculoskeletal ROS (+)   Abdominal   Peds negative pediatric ROS (+)  Hematology negative hematology ROS (+)   Anesthesia Other Findings   Reproductive/Obstetrics negative OB ROS                             Anesthesia Physical Anesthesia Plan  ASA: II  Anesthesia Plan: MAC   Post-op Pain Management:    Induction: Intravenous  PONV Risk Score and Plan: 2 and Treatment may vary due to age or medical condition, Ondansetron and Dexamethasone  Airway Management Planned: Simple Face Mask and Nasal Cannula  Additional Equipment:   Intra-op Plan:   Post-operative Plan:   Informed Consent: I have reviewed the patients History and Physical, chart, labs and discussed the procedure including the risks, benefits and alternatives for the proposed anesthesia with the patient or authorized representative who has indicated his/her understanding and acceptance.     Dental advisory given  Plan Discussed with: CRNA  Anesthesia Plan Comments: (Plan Full PPE use Plan MAC D/W PT -WTP with same after Q&A  Second eye-)         Anesthesia Quick Evaluation

## 2019-03-11 NOTE — Discharge Instructions (Addendum)
PATIENT INSTRUCTIONS °POST-ANESTHESIA ° °IMMEDIATELY FOLLOWING SURGERY:  Do not drive or operate machinery for the first twenty four hours after surgery.  Do not make any important decisions for twenty four hours after surgery or while taking narcotic pain medications or sedatives.  If you develop intractable nausea and vomiting or a severe headache please notify your doctor immediately. ° °FOLLOW-UP:  Please make an appointment with your surgeon as instructed. You do not need to follow up with anesthesia unless specifically instructed to do so. ° °WOUND CARE INSTRUCTIONS (if applicable):  Keep a dry clean dressing on the anesthesia/puncture wound site if there is drainage.  Once the wound has quit draining you may leave it open to air.  Generally you should leave the bandage intact for twenty four hours unless there is drainage.  If the epidural site drains for more than 36-48 hours please call the anesthesia department. ° °QUESTIONS?:  Please feel free to call your physician or the hospital operator if you have any questions, and they will be happy to assist you.    ° ° °Please discharge patient when stable, will follow up today with Dr. Wrzosek at the [facility] office immediately following discharge.  Leave shield in place until visit.  All paperwork with discharge instructions will be given at the office. ° °

## 2019-03-11 NOTE — Interval H&P Note (Signed)
History and Physical Interval Note: The H and P was reviewed and updated. The patient was examined.  No changes were found after exam.  The surgical eye was marked.  03/11/2019 8:24 AM  Lacey Tran  has presented today for surgery, with the diagnosis of Nuclear sclerotic cataract - Right eye.  The various methods of treatment have been discussed with the patient and family. After consideration of risks, benefits and other options for treatment, the patient has consented to  Procedure(s) with comments: CATARACT EXTRACTION PHACO AND INTRAOCULAR LENS PLACEMENT (IOC) (Right) - right as a surgical intervention.  The patient's history has been reviewed, patient examined, no change in status, stable for surgery.  I have reviewed the patient's chart and labs.  Questions were answered to the patient's satisfaction.     Baruch Goldmann

## 2019-03-11 NOTE — Transfer of Care (Signed)
Immediate Anesthesia Transfer of Care Note  Patient: Lacey Tran  Procedure(s) Performed: CATARACT EXTRACTION PHACO AND INTRAOCULAR LENS PLACEMENT (IOC) (Right Eye)  Patient Location: Short Stay  Anesthesia Type:MAC  Level of Consciousness: awake  Airway & Oxygen Therapy: Patient Spontanous Breathing  Post-op Assessment: Report given to RN  Post vital signs: Reviewed  Last Vitals:  Vitals Value Taken Time  BP    Temp    Pulse    Resp    SpO2      Last Pain:  Vitals:   03/11/19 0715  PainSc: 0-No pain         Complications: No apparent anesthesia complications

## 2019-03-12 ENCOUNTER — Encounter (HOSPITAL_COMMUNITY): Payer: Self-pay | Admitting: Ophthalmology

## 2019-04-01 DIAGNOSIS — N3000 Acute cystitis without hematuria: Secondary | ICD-10-CM | POA: Diagnosis not present

## 2019-04-01 DIAGNOSIS — Z6824 Body mass index (BMI) 24.0-24.9, adult: Secondary | ICD-10-CM | POA: Diagnosis not present

## 2019-04-04 DIAGNOSIS — Z961 Presence of intraocular lens: Secondary | ICD-10-CM | POA: Diagnosis not present

## 2019-06-10 DIAGNOSIS — I10 Essential (primary) hypertension: Secondary | ICD-10-CM | POA: Diagnosis not present

## 2019-06-10 DIAGNOSIS — M109 Gout, unspecified: Secondary | ICD-10-CM | POA: Diagnosis not present

## 2019-06-10 DIAGNOSIS — E876 Hypokalemia: Secondary | ICD-10-CM | POA: Diagnosis not present

## 2019-06-10 DIAGNOSIS — I480 Paroxysmal atrial fibrillation: Secondary | ICD-10-CM | POA: Diagnosis not present

## 2019-06-10 DIAGNOSIS — E7800 Pure hypercholesterolemia, unspecified: Secondary | ICD-10-CM | POA: Diagnosis not present

## 2019-06-13 DIAGNOSIS — I10 Essential (primary) hypertension: Secondary | ICD-10-CM | POA: Diagnosis not present

## 2019-06-13 DIAGNOSIS — M109 Gout, unspecified: Secondary | ICD-10-CM | POA: Diagnosis not present

## 2019-06-13 DIAGNOSIS — E7800 Pure hypercholesterolemia, unspecified: Secondary | ICD-10-CM | POA: Diagnosis not present

## 2019-06-13 DIAGNOSIS — I480 Paroxysmal atrial fibrillation: Secondary | ICD-10-CM | POA: Diagnosis not present

## 2019-06-13 DIAGNOSIS — E876 Hypokalemia: Secondary | ICD-10-CM | POA: Diagnosis not present

## 2019-06-13 DIAGNOSIS — Z6824 Body mass index (BMI) 24.0-24.9, adult: Secondary | ICD-10-CM | POA: Diagnosis not present

## 2019-10-07 DIAGNOSIS — H04123 Dry eye syndrome of bilateral lacrimal glands: Secondary | ICD-10-CM | POA: Diagnosis not present

## 2019-10-07 DIAGNOSIS — H26491 Other secondary cataract, right eye: Secondary | ICD-10-CM | POA: Diagnosis not present

## 2019-12-05 DIAGNOSIS — I10 Essential (primary) hypertension: Secondary | ICD-10-CM | POA: Diagnosis not present

## 2019-12-05 DIAGNOSIS — E876 Hypokalemia: Secondary | ICD-10-CM | POA: Diagnosis not present

## 2019-12-05 DIAGNOSIS — E7800 Pure hypercholesterolemia, unspecified: Secondary | ICD-10-CM | POA: Diagnosis not present

## 2019-12-05 DIAGNOSIS — Z13220 Encounter for screening for lipoid disorders: Secondary | ICD-10-CM | POA: Diagnosis not present

## 2019-12-05 DIAGNOSIS — M109 Gout, unspecified: Secondary | ICD-10-CM | POA: Diagnosis not present

## 2019-12-11 DIAGNOSIS — Z6824 Body mass index (BMI) 24.0-24.9, adult: Secondary | ICD-10-CM | POA: Diagnosis not present

## 2019-12-11 DIAGNOSIS — Z1389 Encounter for screening for other disorder: Secondary | ICD-10-CM | POA: Diagnosis not present

## 2019-12-11 DIAGNOSIS — E876 Hypokalemia: Secondary | ICD-10-CM | POA: Diagnosis not present

## 2019-12-11 DIAGNOSIS — I10 Essential (primary) hypertension: Secondary | ICD-10-CM | POA: Diagnosis not present

## 2019-12-11 DIAGNOSIS — E7800 Pure hypercholesterolemia, unspecified: Secondary | ICD-10-CM | POA: Diagnosis not present

## 2019-12-11 DIAGNOSIS — I480 Paroxysmal atrial fibrillation: Secondary | ICD-10-CM | POA: Diagnosis not present

## 2019-12-11 DIAGNOSIS — M109 Gout, unspecified: Secondary | ICD-10-CM | POA: Diagnosis not present

## 2020-04-13 DIAGNOSIS — H524 Presbyopia: Secondary | ICD-10-CM | POA: Diagnosis not present

## 2020-05-19 DIAGNOSIS — R350 Frequency of micturition: Secondary | ICD-10-CM | POA: Diagnosis not present

## 2020-05-19 DIAGNOSIS — R42 Dizziness and giddiness: Secondary | ICD-10-CM | POA: Diagnosis not present

## 2020-05-19 DIAGNOSIS — Z6823 Body mass index (BMI) 23.0-23.9, adult: Secondary | ICD-10-CM | POA: Diagnosis not present

## 2020-06-12 DIAGNOSIS — I10 Essential (primary) hypertension: Secondary | ICD-10-CM | POA: Diagnosis not present

## 2020-06-12 DIAGNOSIS — E876 Hypokalemia: Secondary | ICD-10-CM | POA: Diagnosis not present

## 2020-06-12 DIAGNOSIS — M109 Gout, unspecified: Secondary | ICD-10-CM | POA: Diagnosis not present

## 2020-06-12 DIAGNOSIS — E7800 Pure hypercholesterolemia, unspecified: Secondary | ICD-10-CM | POA: Diagnosis not present

## 2020-06-16 DIAGNOSIS — N3090 Cystitis, unspecified without hematuria: Secondary | ICD-10-CM | POA: Diagnosis not present

## 2020-06-16 DIAGNOSIS — M109 Gout, unspecified: Secondary | ICD-10-CM | POA: Diagnosis not present

## 2020-06-16 DIAGNOSIS — N183 Chronic kidney disease, stage 3 unspecified: Secondary | ICD-10-CM | POA: Diagnosis not present

## 2020-06-16 DIAGNOSIS — I10 Essential (primary) hypertension: Secondary | ICD-10-CM | POA: Diagnosis not present

## 2020-06-16 DIAGNOSIS — Z0000 Encounter for general adult medical examination without abnormal findings: Secondary | ICD-10-CM | POA: Diagnosis not present

## 2020-06-16 DIAGNOSIS — I480 Paroxysmal atrial fibrillation: Secondary | ICD-10-CM | POA: Diagnosis not present

## 2020-06-16 DIAGNOSIS — E876 Hypokalemia: Secondary | ICD-10-CM | POA: Diagnosis not present

## 2020-06-16 DIAGNOSIS — E7800 Pure hypercholesterolemia, unspecified: Secondary | ICD-10-CM | POA: Diagnosis not present

## 2020-10-12 DIAGNOSIS — H353131 Nonexudative age-related macular degeneration, bilateral, early dry stage: Secondary | ICD-10-CM | POA: Diagnosis not present

## 2020-10-19 DIAGNOSIS — Z6823 Body mass index (BMI) 23.0-23.9, adult: Secondary | ICD-10-CM | POA: Diagnosis not present

## 2020-10-19 DIAGNOSIS — M109 Gout, unspecified: Secondary | ICD-10-CM | POA: Diagnosis not present

## 2020-11-02 DIAGNOSIS — Z6823 Body mass index (BMI) 23.0-23.9, adult: Secondary | ICD-10-CM | POA: Diagnosis not present

## 2020-11-02 DIAGNOSIS — M109 Gout, unspecified: Secondary | ICD-10-CM | POA: Diagnosis not present

## 2020-11-02 DIAGNOSIS — R600 Localized edema: Secondary | ICD-10-CM | POA: Diagnosis not present

## 2020-12-01 DIAGNOSIS — Z1329 Encounter for screening for other suspected endocrine disorder: Secondary | ICD-10-CM | POA: Diagnosis not present

## 2020-12-01 DIAGNOSIS — N3000 Acute cystitis without hematuria: Secondary | ICD-10-CM | POA: Diagnosis not present

## 2020-12-01 DIAGNOSIS — R5383 Other fatigue: Secondary | ICD-10-CM | POA: Diagnosis not present

## 2020-12-01 DIAGNOSIS — Z7689 Persons encountering health services in other specified circumstances: Secondary | ICD-10-CM | POA: Diagnosis not present

## 2020-12-01 DIAGNOSIS — Z6820 Body mass index (BMI) 20.0-20.9, adult: Secondary | ICD-10-CM | POA: Diagnosis not present

## 2020-12-01 DIAGNOSIS — R42 Dizziness and giddiness: Secondary | ICD-10-CM | POA: Diagnosis not present

## 2020-12-01 DIAGNOSIS — R350 Frequency of micturition: Secondary | ICD-10-CM | POA: Diagnosis not present

## 2020-12-01 DIAGNOSIS — E559 Vitamin D deficiency, unspecified: Secondary | ICD-10-CM | POA: Diagnosis not present

## 2020-12-01 DIAGNOSIS — R300 Dysuria: Secondary | ICD-10-CM | POA: Diagnosis not present

## 2020-12-01 DIAGNOSIS — R634 Abnormal weight loss: Secondary | ICD-10-CM | POA: Diagnosis not present

## 2020-12-02 DIAGNOSIS — I5189 Other ill-defined heart diseases: Secondary | ICD-10-CM | POA: Diagnosis not present

## 2020-12-02 DIAGNOSIS — W19XXXA Unspecified fall, initial encounter: Secondary | ICD-10-CM | POA: Diagnosis not present

## 2020-12-02 DIAGNOSIS — Z7982 Long term (current) use of aspirin: Secondary | ICD-10-CM | POA: Diagnosis not present

## 2020-12-02 DIAGNOSIS — E785 Hyperlipidemia, unspecified: Secondary | ICD-10-CM | POA: Diagnosis not present

## 2020-12-02 DIAGNOSIS — K5730 Diverticulosis of large intestine without perforation or abscess without bleeding: Secondary | ICD-10-CM | POA: Diagnosis not present

## 2020-12-02 DIAGNOSIS — J22 Unspecified acute lower respiratory infection: Secondary | ICD-10-CM | POA: Diagnosis not present

## 2020-12-02 DIAGNOSIS — I340 Nonrheumatic mitral (valve) insufficiency: Secondary | ICD-10-CM | POA: Diagnosis not present

## 2020-12-02 DIAGNOSIS — R42 Dizziness and giddiness: Secondary | ICD-10-CM | POA: Diagnosis not present

## 2020-12-02 DIAGNOSIS — R262 Difficulty in walking, not elsewhere classified: Secondary | ICD-10-CM | POA: Diagnosis not present

## 2020-12-02 DIAGNOSIS — R509 Fever, unspecified: Secondary | ICD-10-CM | POA: Diagnosis not present

## 2020-12-02 DIAGNOSIS — M109 Gout, unspecified: Secondary | ICD-10-CM | POA: Diagnosis not present

## 2020-12-02 DIAGNOSIS — N3090 Cystitis, unspecified without hematuria: Secondary | ICD-10-CM | POA: Diagnosis not present

## 2020-12-02 DIAGNOSIS — N1831 Chronic kidney disease, stage 3a: Secondary | ICD-10-CM | POA: Diagnosis not present

## 2020-12-02 DIAGNOSIS — Z6822 Body mass index (BMI) 22.0-22.9, adult: Secondary | ICD-10-CM | POA: Diagnosis not present

## 2020-12-02 DIAGNOSIS — S300XXA Contusion of lower back and pelvis, initial encounter: Secondary | ICD-10-CM | POA: Diagnosis not present

## 2020-12-02 DIAGNOSIS — E860 Dehydration: Secondary | ICD-10-CM | POA: Diagnosis not present

## 2020-12-02 DIAGNOSIS — N390 Urinary tract infection, site not specified: Secondary | ICD-10-CM | POA: Diagnosis not present

## 2020-12-02 DIAGNOSIS — E876 Hypokalemia: Secondary | ICD-10-CM | POA: Diagnosis not present

## 2020-12-02 DIAGNOSIS — N3000 Acute cystitis without hematuria: Secondary | ICD-10-CM | POA: Diagnosis not present

## 2020-12-02 DIAGNOSIS — I081 Rheumatic disorders of both mitral and tricuspid valves: Secondary | ICD-10-CM | POA: Diagnosis not present

## 2020-12-02 DIAGNOSIS — R778 Other specified abnormalities of plasma proteins: Secondary | ICD-10-CM | POA: Diagnosis not present

## 2020-12-02 DIAGNOSIS — Z20822 Contact with and (suspected) exposure to covid-19: Secondary | ICD-10-CM | POA: Diagnosis not present

## 2020-12-02 DIAGNOSIS — G319 Degenerative disease of nervous system, unspecified: Secondary | ICD-10-CM | POA: Diagnosis not present

## 2020-12-02 DIAGNOSIS — E871 Hypo-osmolality and hyponatremia: Secondary | ICD-10-CM | POA: Diagnosis not present

## 2020-12-02 DIAGNOSIS — M533 Sacrococcygeal disorders, not elsewhere classified: Secondary | ICD-10-CM | POA: Diagnosis not present

## 2020-12-02 DIAGNOSIS — R531 Weakness: Secondary | ICD-10-CM | POA: Diagnosis not present

## 2020-12-02 DIAGNOSIS — R638 Other symptoms and signs concerning food and fluid intake: Secondary | ICD-10-CM | POA: Diagnosis not present

## 2020-12-02 DIAGNOSIS — I129 Hypertensive chronic kidney disease with stage 1 through stage 4 chronic kidney disease, or unspecified chronic kidney disease: Secondary | ICD-10-CM | POA: Diagnosis not present

## 2020-12-02 DIAGNOSIS — E878 Other disorders of electrolyte and fluid balance, not elsewhere classified: Secondary | ICD-10-CM | POA: Diagnosis not present

## 2020-12-02 DIAGNOSIS — E440 Moderate protein-calorie malnutrition: Secondary | ICD-10-CM | POA: Diagnosis not present

## 2020-12-14 DIAGNOSIS — E782 Mixed hyperlipidemia: Secondary | ICD-10-CM | POA: Diagnosis not present

## 2020-12-14 DIAGNOSIS — E876 Hypokalemia: Secondary | ICD-10-CM | POA: Diagnosis not present

## 2020-12-14 DIAGNOSIS — R300 Dysuria: Secondary | ICD-10-CM | POA: Diagnosis not present

## 2020-12-14 DIAGNOSIS — I10 Essential (primary) hypertension: Secondary | ICD-10-CM | POA: Diagnosis not present

## 2020-12-14 DIAGNOSIS — R609 Edema, unspecified: Secondary | ICD-10-CM | POA: Diagnosis not present

## 2020-12-14 DIAGNOSIS — E7849 Other hyperlipidemia: Secondary | ICD-10-CM | POA: Diagnosis not present

## 2020-12-14 DIAGNOSIS — M109 Gout, unspecified: Secondary | ICD-10-CM | POA: Diagnosis not present

## 2020-12-14 DIAGNOSIS — E860 Dehydration: Secondary | ICD-10-CM | POA: Diagnosis not present

## 2021-01-06 DIAGNOSIS — M109 Gout, unspecified: Secondary | ICD-10-CM | POA: Diagnosis not present

## 2021-01-06 DIAGNOSIS — E7849 Other hyperlipidemia: Secondary | ICD-10-CM | POA: Diagnosis not present

## 2021-01-06 DIAGNOSIS — R609 Edema, unspecified: Secondary | ICD-10-CM | POA: Diagnosis not present

## 2021-01-06 DIAGNOSIS — I10 Essential (primary) hypertension: Secondary | ICD-10-CM | POA: Diagnosis not present

## 2021-01-06 DIAGNOSIS — E860 Dehydration: Secondary | ICD-10-CM | POA: Diagnosis not present

## 2021-01-06 DIAGNOSIS — E876 Hypokalemia: Secondary | ICD-10-CM | POA: Diagnosis not present

## 2021-02-04 DIAGNOSIS — E7849 Other hyperlipidemia: Secondary | ICD-10-CM | POA: Diagnosis not present

## 2021-02-04 DIAGNOSIS — E782 Mixed hyperlipidemia: Secondary | ICD-10-CM | POA: Diagnosis not present

## 2021-02-04 DIAGNOSIS — R5383 Other fatigue: Secondary | ICD-10-CM | POA: Diagnosis not present

## 2021-02-04 DIAGNOSIS — I10 Essential (primary) hypertension: Secondary | ICD-10-CM | POA: Diagnosis not present

## 2021-02-04 DIAGNOSIS — N183 Chronic kidney disease, stage 3 unspecified: Secondary | ICD-10-CM | POA: Diagnosis not present

## 2021-02-04 DIAGNOSIS — E876 Hypokalemia: Secondary | ICD-10-CM | POA: Diagnosis not present

## 2021-02-11 DIAGNOSIS — I480 Paroxysmal atrial fibrillation: Secondary | ICD-10-CM | POA: Diagnosis not present

## 2021-02-11 DIAGNOSIS — Z1389 Encounter for screening for other disorder: Secondary | ICD-10-CM | POA: Diagnosis not present

## 2021-02-11 DIAGNOSIS — I10 Essential (primary) hypertension: Secondary | ICD-10-CM | POA: Diagnosis not present

## 2021-02-11 DIAGNOSIS — N183 Chronic kidney disease, stage 3 unspecified: Secondary | ICD-10-CM | POA: Diagnosis not present

## 2021-02-11 DIAGNOSIS — M109 Gout, unspecified: Secondary | ICD-10-CM | POA: Diagnosis not present

## 2021-02-11 DIAGNOSIS — E876 Hypokalemia: Secondary | ICD-10-CM | POA: Diagnosis not present

## 2021-02-11 DIAGNOSIS — Z23 Encounter for immunization: Secondary | ICD-10-CM | POA: Diagnosis not present

## 2021-04-15 DIAGNOSIS — H353132 Nonexudative age-related macular degeneration, bilateral, intermediate dry stage: Secondary | ICD-10-CM | POA: Diagnosis not present

## 2021-04-15 DIAGNOSIS — H524 Presbyopia: Secondary | ICD-10-CM | POA: Diagnosis not present

## 2021-05-20 DIAGNOSIS — H353131 Nonexudative age-related macular degeneration, bilateral, early dry stage: Secondary | ICD-10-CM | POA: Diagnosis not present

## 2021-05-20 DIAGNOSIS — H43813 Vitreous degeneration, bilateral: Secondary | ICD-10-CM | POA: Diagnosis not present

## 2021-05-20 DIAGNOSIS — H26492 Other secondary cataract, left eye: Secondary | ICD-10-CM | POA: Diagnosis not present

## 2021-06-07 DIAGNOSIS — I10 Essential (primary) hypertension: Secondary | ICD-10-CM | POA: Diagnosis not present

## 2021-06-07 DIAGNOSIS — E7849 Other hyperlipidemia: Secondary | ICD-10-CM | POA: Diagnosis not present

## 2021-06-07 DIAGNOSIS — N183 Chronic kidney disease, stage 3 unspecified: Secondary | ICD-10-CM | POA: Diagnosis not present

## 2021-06-07 DIAGNOSIS — E876 Hypokalemia: Secondary | ICD-10-CM | POA: Diagnosis not present

## 2021-06-07 DIAGNOSIS — R5383 Other fatigue: Secondary | ICD-10-CM | POA: Diagnosis not present

## 2021-06-07 DIAGNOSIS — E782 Mixed hyperlipidemia: Secondary | ICD-10-CM | POA: Diagnosis not present

## 2021-06-10 DIAGNOSIS — I10 Essential (primary) hypertension: Secondary | ICD-10-CM | POA: Diagnosis not present

## 2021-06-10 DIAGNOSIS — N183 Chronic kidney disease, stage 3 unspecified: Secondary | ICD-10-CM | POA: Diagnosis not present

## 2021-06-10 DIAGNOSIS — M109 Gout, unspecified: Secondary | ICD-10-CM | POA: Diagnosis not present

## 2021-06-10 DIAGNOSIS — E876 Hypokalemia: Secondary | ICD-10-CM | POA: Diagnosis not present

## 2021-06-10 DIAGNOSIS — N3090 Cystitis, unspecified without hematuria: Secondary | ICD-10-CM | POA: Diagnosis not present

## 2021-06-10 DIAGNOSIS — I480 Paroxysmal atrial fibrillation: Secondary | ICD-10-CM | POA: Diagnosis not present

## 2021-06-10 DIAGNOSIS — R350 Frequency of micturition: Secondary | ICD-10-CM | POA: Diagnosis not present

## 2021-06-16 DIAGNOSIS — I10 Essential (primary) hypertension: Secondary | ICD-10-CM | POA: Diagnosis not present

## 2021-06-16 DIAGNOSIS — N183 Chronic kidney disease, stage 3 unspecified: Secondary | ICD-10-CM | POA: Diagnosis not present

## 2021-06-16 DIAGNOSIS — Z6822 Body mass index (BMI) 22.0-22.9, adult: Secondary | ICD-10-CM | POA: Diagnosis not present

## 2021-06-16 DIAGNOSIS — Z0001 Encounter for general adult medical examination with abnormal findings: Secondary | ICD-10-CM | POA: Diagnosis not present

## 2021-06-16 DIAGNOSIS — D649 Anemia, unspecified: Secondary | ICD-10-CM | POA: Diagnosis not present

## 2021-06-16 DIAGNOSIS — I480 Paroxysmal atrial fibrillation: Secondary | ICD-10-CM | POA: Diagnosis not present

## 2021-06-16 DIAGNOSIS — F17210 Nicotine dependence, cigarettes, uncomplicated: Secondary | ICD-10-CM | POA: Diagnosis not present

## 2021-06-16 DIAGNOSIS — E7849 Other hyperlipidemia: Secondary | ICD-10-CM | POA: Diagnosis not present

## 2021-10-12 DIAGNOSIS — E782 Mixed hyperlipidemia: Secondary | ICD-10-CM | POA: Diagnosis not present

## 2021-10-12 DIAGNOSIS — I10 Essential (primary) hypertension: Secondary | ICD-10-CM | POA: Diagnosis not present

## 2021-10-12 DIAGNOSIS — E876 Hypokalemia: Secondary | ICD-10-CM | POA: Diagnosis not present

## 2021-10-12 DIAGNOSIS — M109 Gout, unspecified: Secondary | ICD-10-CM | POA: Diagnosis not present

## 2021-10-12 DIAGNOSIS — E7800 Pure hypercholesterolemia, unspecified: Secondary | ICD-10-CM | POA: Diagnosis not present

## 2021-10-12 DIAGNOSIS — E7849 Other hyperlipidemia: Secondary | ICD-10-CM | POA: Diagnosis not present

## 2021-10-14 DIAGNOSIS — I10 Essential (primary) hypertension: Secondary | ICD-10-CM | POA: Diagnosis not present

## 2021-10-14 DIAGNOSIS — M109 Gout, unspecified: Secondary | ICD-10-CM | POA: Diagnosis not present

## 2021-10-14 DIAGNOSIS — E876 Hypokalemia: Secondary | ICD-10-CM | POA: Diagnosis not present

## 2021-10-14 DIAGNOSIS — I480 Paroxysmal atrial fibrillation: Secondary | ICD-10-CM | POA: Diagnosis not present

## 2021-10-14 DIAGNOSIS — N183 Chronic kidney disease, stage 3 unspecified: Secondary | ICD-10-CM | POA: Diagnosis not present

## 2021-10-14 DIAGNOSIS — D649 Anemia, unspecified: Secondary | ICD-10-CM | POA: Diagnosis not present

## 2021-11-08 DIAGNOSIS — B029 Zoster without complications: Secondary | ICD-10-CM | POA: Diagnosis not present

## 2021-11-08 DIAGNOSIS — Z6822 Body mass index (BMI) 22.0-22.9, adult: Secondary | ICD-10-CM | POA: Diagnosis not present

## 2021-11-22 DIAGNOSIS — B0229 Other postherpetic nervous system involvement: Secondary | ICD-10-CM | POA: Diagnosis not present

## 2021-11-22 DIAGNOSIS — Z6822 Body mass index (BMI) 22.0-22.9, adult: Secondary | ICD-10-CM | POA: Diagnosis not present

## 2021-12-14 DIAGNOSIS — B0229 Other postherpetic nervous system involvement: Secondary | ICD-10-CM | POA: Diagnosis not present

## 2021-12-14 DIAGNOSIS — Z6823 Body mass index (BMI) 23.0-23.9, adult: Secondary | ICD-10-CM | POA: Diagnosis not present

## 2022-04-11 DIAGNOSIS — I10 Essential (primary) hypertension: Secondary | ICD-10-CM | POA: Diagnosis not present

## 2022-04-11 DIAGNOSIS — E7849 Other hyperlipidemia: Secondary | ICD-10-CM | POA: Diagnosis not present

## 2022-04-11 DIAGNOSIS — Z13220 Encounter for screening for lipoid disorders: Secondary | ICD-10-CM | POA: Diagnosis not present

## 2022-04-11 DIAGNOSIS — E782 Mixed hyperlipidemia: Secondary | ICD-10-CM | POA: Diagnosis not present

## 2022-04-11 DIAGNOSIS — N183 Chronic kidney disease, stage 3 unspecified: Secondary | ICD-10-CM | POA: Diagnosis not present

## 2022-04-11 DIAGNOSIS — M109 Gout, unspecified: Secondary | ICD-10-CM | POA: Diagnosis not present

## 2022-04-14 DIAGNOSIS — N1831 Chronic kidney disease, stage 3a: Secondary | ICD-10-CM | POA: Diagnosis not present

## 2022-04-14 DIAGNOSIS — Z6823 Body mass index (BMI) 23.0-23.9, adult: Secondary | ICD-10-CM | POA: Diagnosis not present

## 2022-04-14 DIAGNOSIS — I10 Essential (primary) hypertension: Secondary | ICD-10-CM | POA: Diagnosis not present

## 2022-04-14 DIAGNOSIS — M109 Gout, unspecified: Secondary | ICD-10-CM | POA: Diagnosis not present

## 2022-04-14 DIAGNOSIS — D649 Anemia, unspecified: Secondary | ICD-10-CM | POA: Diagnosis not present

## 2022-04-14 DIAGNOSIS — I480 Paroxysmal atrial fibrillation: Secondary | ICD-10-CM | POA: Diagnosis not present

## 2022-04-14 DIAGNOSIS — Z23 Encounter for immunization: Secondary | ICD-10-CM | POA: Diagnosis not present

## 2022-04-14 DIAGNOSIS — N3090 Cystitis, unspecified without hematuria: Secondary | ICD-10-CM | POA: Diagnosis not present

## 2022-04-14 DIAGNOSIS — R8279 Other abnormal findings on microbiological examination of urine: Secondary | ICD-10-CM | POA: Diagnosis not present

## 2022-04-14 DIAGNOSIS — E7800 Pure hypercholesterolemia, unspecified: Secondary | ICD-10-CM | POA: Diagnosis not present

## 2022-04-14 DIAGNOSIS — E876 Hypokalemia: Secondary | ICD-10-CM | POA: Diagnosis not present

## 2022-10-05 DIAGNOSIS — E782 Mixed hyperlipidemia: Secondary | ICD-10-CM | POA: Diagnosis not present

## 2022-10-05 DIAGNOSIS — E876 Hypokalemia: Secondary | ICD-10-CM | POA: Diagnosis not present

## 2022-10-05 DIAGNOSIS — M109 Gout, unspecified: Secondary | ICD-10-CM | POA: Diagnosis not present

## 2022-10-05 DIAGNOSIS — N183 Chronic kidney disease, stage 3 unspecified: Secondary | ICD-10-CM | POA: Diagnosis not present

## 2022-10-05 DIAGNOSIS — R5383 Other fatigue: Secondary | ICD-10-CM | POA: Diagnosis not present

## 2022-10-06 DIAGNOSIS — E876 Hypokalemia: Secondary | ICD-10-CM | POA: Diagnosis not present

## 2022-10-06 DIAGNOSIS — M109 Gout, unspecified: Secondary | ICD-10-CM | POA: Diagnosis not present

## 2022-10-06 DIAGNOSIS — R739 Hyperglycemia, unspecified: Secondary | ICD-10-CM | POA: Diagnosis not present

## 2022-10-06 DIAGNOSIS — E7849 Other hyperlipidemia: Secondary | ICD-10-CM | POA: Diagnosis not present

## 2022-10-06 DIAGNOSIS — N183 Chronic kidney disease, stage 3 unspecified: Secondary | ICD-10-CM | POA: Diagnosis not present

## 2022-10-13 DIAGNOSIS — R7303 Prediabetes: Secondary | ICD-10-CM | POA: Diagnosis not present

## 2022-10-13 DIAGNOSIS — I10 Essential (primary) hypertension: Secondary | ICD-10-CM | POA: Diagnosis not present

## 2022-10-13 DIAGNOSIS — N3090 Cystitis, unspecified without hematuria: Secondary | ICD-10-CM | POA: Diagnosis not present

## 2022-10-13 DIAGNOSIS — N1831 Chronic kidney disease, stage 3a: Secondary | ICD-10-CM | POA: Diagnosis not present

## 2022-10-13 DIAGNOSIS — M109 Gout, unspecified: Secondary | ICD-10-CM | POA: Diagnosis not present

## 2022-10-13 DIAGNOSIS — E876 Hypokalemia: Secondary | ICD-10-CM | POA: Diagnosis not present

## 2022-10-13 DIAGNOSIS — I480 Paroxysmal atrial fibrillation: Secondary | ICD-10-CM | POA: Diagnosis not present

## 2022-10-13 DIAGNOSIS — E7800 Pure hypercholesterolemia, unspecified: Secondary | ICD-10-CM | POA: Diagnosis not present

## 2022-10-13 DIAGNOSIS — Z0001 Encounter for general adult medical examination with abnormal findings: Secondary | ICD-10-CM | POA: Diagnosis not present

## 2022-10-13 DIAGNOSIS — D649 Anemia, unspecified: Secondary | ICD-10-CM | POA: Diagnosis not present

## 2022-10-13 DIAGNOSIS — Z23 Encounter for immunization: Secondary | ICD-10-CM | POA: Diagnosis not present

## 2022-10-13 DIAGNOSIS — Z6823 Body mass index (BMI) 23.0-23.9, adult: Secondary | ICD-10-CM | POA: Diagnosis not present

## 2023-04-03 DIAGNOSIS — E876 Hypokalemia: Secondary | ICD-10-CM | POA: Diagnosis not present

## 2023-04-03 DIAGNOSIS — M109 Gout, unspecified: Secondary | ICD-10-CM | POA: Diagnosis not present

## 2023-04-03 DIAGNOSIS — E782 Mixed hyperlipidemia: Secondary | ICD-10-CM | POA: Diagnosis not present

## 2023-04-03 DIAGNOSIS — N183 Chronic kidney disease, stage 3 unspecified: Secondary | ICD-10-CM | POA: Diagnosis not present

## 2023-04-03 DIAGNOSIS — R7303 Prediabetes: Secondary | ICD-10-CM | POA: Diagnosis not present

## 2023-04-03 DIAGNOSIS — E7849 Other hyperlipidemia: Secondary | ICD-10-CM | POA: Diagnosis not present

## 2023-04-10 DIAGNOSIS — E876 Hypokalemia: Secondary | ICD-10-CM | POA: Diagnosis not present

## 2023-04-10 DIAGNOSIS — I10 Essential (primary) hypertension: Secondary | ICD-10-CM | POA: Diagnosis not present

## 2023-04-10 DIAGNOSIS — I480 Paroxysmal atrial fibrillation: Secondary | ICD-10-CM | POA: Diagnosis not present

## 2023-04-10 DIAGNOSIS — N1831 Chronic kidney disease, stage 3a: Secondary | ICD-10-CM | POA: Diagnosis not present

## 2023-04-10 DIAGNOSIS — Z23 Encounter for immunization: Secondary | ICD-10-CM | POA: Diagnosis not present

## 2023-04-10 DIAGNOSIS — R7303 Prediabetes: Secondary | ICD-10-CM | POA: Diagnosis not present

## 2023-04-10 DIAGNOSIS — M109 Gout, unspecified: Secondary | ICD-10-CM | POA: Diagnosis not present

## 2023-04-10 DIAGNOSIS — Z6822 Body mass index (BMI) 22.0-22.9, adult: Secondary | ICD-10-CM | POA: Diagnosis not present

## 2023-04-10 DIAGNOSIS — D649 Anemia, unspecified: Secondary | ICD-10-CM | POA: Diagnosis not present

## 2023-04-10 DIAGNOSIS — N3090 Cystitis, unspecified without hematuria: Secondary | ICD-10-CM | POA: Diagnosis not present

## 2023-10-02 DIAGNOSIS — M109 Gout, unspecified: Secondary | ICD-10-CM | POA: Diagnosis not present

## 2023-10-02 DIAGNOSIS — E7849 Other hyperlipidemia: Secondary | ICD-10-CM | POA: Diagnosis not present

## 2023-10-02 DIAGNOSIS — E876 Hypokalemia: Secondary | ICD-10-CM | POA: Diagnosis not present

## 2023-10-02 DIAGNOSIS — Z131 Encounter for screening for diabetes mellitus: Secondary | ICD-10-CM | POA: Diagnosis not present

## 2023-10-02 DIAGNOSIS — R42 Dizziness and giddiness: Secondary | ICD-10-CM | POA: Diagnosis not present

## 2023-10-02 DIAGNOSIS — R7989 Other specified abnormal findings of blood chemistry: Secondary | ICD-10-CM | POA: Diagnosis not present

## 2023-10-09 DIAGNOSIS — E782 Mixed hyperlipidemia: Secondary | ICD-10-CM | POA: Diagnosis not present

## 2023-10-09 DIAGNOSIS — Z0001 Encounter for general adult medical examination with abnormal findings: Secondary | ICD-10-CM | POA: Diagnosis not present

## 2023-10-09 DIAGNOSIS — Z6823 Body mass index (BMI) 23.0-23.9, adult: Secondary | ICD-10-CM | POA: Diagnosis not present

## 2023-10-09 DIAGNOSIS — Z1389 Encounter for screening for other disorder: Secondary | ICD-10-CM | POA: Diagnosis not present

## 2023-10-09 DIAGNOSIS — N1832 Chronic kidney disease, stage 3b: Secondary | ICD-10-CM | POA: Diagnosis not present

## 2023-10-09 DIAGNOSIS — R7303 Prediabetes: Secondary | ICD-10-CM | POA: Diagnosis not present

## 2023-10-09 DIAGNOSIS — Z0000 Encounter for general adult medical examination without abnormal findings: Secondary | ICD-10-CM | POA: Diagnosis not present

## 2023-10-09 DIAGNOSIS — E7849 Other hyperlipidemia: Secondary | ICD-10-CM | POA: Diagnosis not present

## 2023-12-20 ENCOUNTER — Other Ambulatory Visit (HOSPITAL_BASED_OUTPATIENT_CLINIC_OR_DEPARTMENT_OTHER): Payer: Self-pay

## 2023-12-20 MED ORDER — POTASSIUM CHLORIDE CRYS ER 20 MEQ PO TBCR
40.0000 meq | EXTENDED_RELEASE_TABLET | Freq: Every day | ORAL | 4 refills | Status: AC
Start: 2023-02-20 — End: ?
  Filled 2024-02-13: qty 180, 90d supply, fill #0

## 2023-12-20 MED ORDER — PRAVASTATIN SODIUM 10 MG PO TABS
10.0000 mg | ORAL_TABLET | Freq: Every day | ORAL | 2 refills | Status: AC
  Filled 2024-02-13: qty 90, 90d supply, fill #0
  Filled 2024-05-15: qty 90, 90d supply, fill #1

## 2023-12-25 ENCOUNTER — Other Ambulatory Visit (HOSPITAL_BASED_OUTPATIENT_CLINIC_OR_DEPARTMENT_OTHER): Payer: Self-pay

## 2024-02-13 ENCOUNTER — Other Ambulatory Visit (HOSPITAL_BASED_OUTPATIENT_CLINIC_OR_DEPARTMENT_OTHER): Payer: Self-pay

## 2024-05-15 ENCOUNTER — Other Ambulatory Visit (HOSPITAL_BASED_OUTPATIENT_CLINIC_OR_DEPARTMENT_OTHER): Payer: Self-pay

## 2024-05-15 MED ORDER — POTASSIUM CHLORIDE CRYS ER 20 MEQ PO TBCR
40.0000 meq | EXTENDED_RELEASE_TABLET | Freq: Every day | ORAL | 4 refills | Status: AC
  Filled 2024-05-15: qty 180, 90d supply, fill #0
